# Patient Record
Sex: Female | Born: 2000 | Race: White | Hispanic: No | Marital: Single | Smoking: Former smoker
Health system: Southern US, Community
[De-identification: ages and names within clinical notes are randomized; demographics above are authoritative.]

## PROBLEM LIST (undated history)

## (undated) DIAGNOSIS — D649 Anemia, unspecified: Secondary | ICD-10-CM

## (undated) DIAGNOSIS — B999 Unspecified infectious disease: Secondary | ICD-10-CM

## (undated) HISTORY — PX: NO PAST SURGERIES: SHX2092

---
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
## 2020-01-01 ENCOUNTER — Other Ambulatory Visit: Payer: Self-pay

## 2020-01-01 ENCOUNTER — Encounter (HOSPITAL_COMMUNITY): Payer: Self-pay | Admitting: *Deleted

## 2020-01-01 ENCOUNTER — Emergency Department (HOSPITAL_COMMUNITY)
Admission: EM | Admit: 2020-01-01 | Discharge: 2020-01-01 | Disposition: A | Discharge status: Home / Self Care | Payer: Medicaid Other | Attending: Emergency Medicine | Admitting: Emergency Medicine

## 2020-01-01 ENCOUNTER — Telehealth: Payer: Self-pay | Admitting: *Deleted

## 2020-01-01 DIAGNOSIS — Z91040 Latex allergy status: Secondary | ICD-10-CM | POA: Diagnosis not present

## 2020-01-01 DIAGNOSIS — N898 Other specified noninflammatory disorders of vagina: Secondary | ICD-10-CM | POA: Diagnosis not present

## 2020-01-01 DIAGNOSIS — Y998 Other external cause status: Secondary | ICD-10-CM | POA: Insufficient documentation

## 2020-01-01 DIAGNOSIS — Z79899 Other long term (current) drug therapy: Secondary | ICD-10-CM | POA: Diagnosis not present

## 2020-01-01 DIAGNOSIS — Y9389 Activity, other specified: Secondary | ICD-10-CM | POA: Diagnosis not present

## 2020-01-01 DIAGNOSIS — R102 Pelvic and perineal pain: Secondary | ICD-10-CM | POA: Diagnosis not present

## 2020-01-01 DIAGNOSIS — W108XXA Fall (on) (from) other stairs and steps, initial encounter: Secondary | ICD-10-CM | POA: Insufficient documentation

## 2020-01-01 DIAGNOSIS — Z3A01 Less than 8 weeks gestation of pregnancy: Secondary | ICD-10-CM | POA: Insufficient documentation

## 2020-01-01 DIAGNOSIS — O23591 Infection of other part of genital tract in pregnancy, first trimester: Secondary | ICD-10-CM | POA: Insufficient documentation

## 2020-01-01 DIAGNOSIS — Y9289 Other specified places as the place of occurrence of the external cause: Secondary | ICD-10-CM | POA: Insufficient documentation

## 2020-01-01 LAB — BASIC METABOLIC PANEL
Anion gap: 8 (ref 5–15)
BUN: 9 mg/dL (ref 6–20)
CO2: 24 mmol/L (ref 22–32)
Calcium: 9 mg/dL (ref 8.9–10.3)
Chloride: 107 mmol/L (ref 98–111)
Creatinine, Ser: 0.61 mg/dL (ref 0.44–1.00)
GFR calc Af Amer: >60 mL/min (ref >60)
GFR calc non Af Amer: >60 mL/min (ref >60)
Glucose, Bld: 86 mg/dL (ref 70–99)
Potassium: 3.6 mmol/L (ref 3.5–5.1)
Sodium: 139 mmol/L (ref 135–145)

## 2020-01-01 LAB — URINALYSIS, ROUTINE W REFLEX MICROSCOPIC
Bilirubin Urine: NEGATIVE
Glucose, UA: NEGATIVE mg/dL
Hgb urine dipstick: NEGATIVE
Ketones, ur: NEGATIVE mg/dL
Leukocytes,Ua: NEGATIVE
Nitrite: NEGATIVE
Protein, ur: NEGATIVE mg/dL
Specific Gravity, Urine: 1.02 (ref 1.005–1.030)
pH: 5 (ref 5.0–8.0)

## 2020-01-01 LAB — WET PREP, GENITAL
Clue Cells Wet Prep HPF POC: NONE SEEN
Sperm: NONE SEEN
Trich, Wet Prep: NONE SEEN
Yeast Wet Prep HPF POC: NONE SEEN

## 2020-01-01 LAB — CBC
HCT: 42.4 % (ref 36.0–46.0)
Hemoglobin: 13.8 g/dL (ref 12.0–15.0)
MCH: 30.3 pg (ref 26.0–34.0)
MCHC: 32.5 g/dL (ref 30.0–36.0)
MCV: 93 fL (ref 80.0–100.0)
Platelets: 283 10*3/uL (ref 150–400)
RBC: 4.56 MIL/uL (ref 3.87–5.11)
RDW: 14.3 % (ref 11.5–15.5)
WBC: 14.1 10*3/uL — ABNORMAL HIGH (ref 4.0–10.5)
nRBC: 0 % (ref 0.0–0.2)

## 2020-01-01 LAB — HCG, QUANTITATIVE, PREGNANCY: hCG, Beta Chain, Quant, S: 28381 m[IU]/mL — ABNORMAL HIGH (ref <5)

## 2020-01-01 MED ORDER — ACETAMINOPHEN 325 MG PO TABS
650.0000 mg | ORAL_TABLET | Freq: Once | ORAL | Status: AC
  Administered 2020-01-01: 19:00:00 650 mg via ORAL
  Filled 2020-01-01: qty 2

## 2020-01-01 NOTE — ED Triage Notes (Signed)
Pt slipped and fell on steps mid flight of stairs. Denies hitting her head.  Pt with abd cramping since fall, pt is about 6-[redacted] weeks pregnant.

## 2020-01-01 NOTE — ED Triage Notes (Signed)
Denies any vaginal bleeding but states new thick discharge since.

## 2020-01-01 NOTE — ED Provider Notes (Signed)
[facility] EMERGENCY DEPARTMENT Provider Note   CSN: [id number] Arrival date & time: 01/01/20  1809     History Chief Complaint  Patient presents with  . Fall    Renee Crawford is a 19 y.o. female presenting for evaluation of abdominal cramping and vaginal discharge.  Patient states she has had mucousy vaginal discharge today.  She also reports intermittent lower abdominal cramping.  Cramping last for several minutes and is severe, occurring every few hours.  It resolved without intervention.  There is nothing she can do to bring on the cramping.  Patient states yesterday she slipped on some stairs and fell backwards, landing on her back and going down the last couple stairs on her back.  She denies hitting her abdomen or her head.  She reports minimal to no pain in her back.  She is not taking anything for her cramping including Tylenol.  She denies urinary symptoms such as dysuria or hematuria.  She is 6 to [redacted] weeks pregnant.  She has had an ultrasound, but no confirmed IUP as ultrasound was too early.  She denies vaginal bleeding.  She has no other medical problems, takes no medications daily.  HPI     Past Medical History:  Diagnosis Date  . Anemia     There are no problems to display for this patient.   History reviewed. No pertinent surgical history.   OB History    Gravida  1   Para      Term      Preterm      AB      Living        SAB      TAB      Ectopic      Multiple      Live Births              Family History  Problem Relation Age of Onset  . Asthma Mother   . Healthy Father     Social History   Tobacco Use  . Smoking status: Never Smoker  . Smokeless tobacco: Never Used  Substance Use Topics  . Alcohol use: No  . Drug use: No    Home Medications Prior to Admission medications   Medication Sig Start Date End Date Taking? Authorizing Provider  Prenatal Vit-DSS-Fe Fum-FA (PRENATAL 19) tablet Take 1 tablet by mouth daily.  12/07/19  Yes Wurst, [place], PA-C  clindamycin (CLEOCIN) 300 MG capsule Take 1 capsule (300 mg total) by mouth 3 (three) times daily. X 7 days Patient not taking: Reported on 01/01/2020 12/20/19   Dartha Lodge, PA-C    Allergies    Sulfa antibiotics, Amoxicillin-pot clavulanate, and Latex  Review of Systems   Review of Systems  Genitourinary: Positive for pelvic pain and vaginal discharge.  All other systems reviewed and are negative.   Physical Exam Updated Vital Signs BP 109/69   Pulse 88   Temp 98.3 F (36.8 C) (Oral)   Resp 19   Ht 5\' 4"  (1.626 m)   Wt 90.7 kg   LMP 12/11/2019 (Exact Date)   SpO2 99%   BMI 34.33 kg/m   Physical Exam Vitals and nursing note reviewed. Exam conducted with a chaperone present.  Constitutional:      General: She is not in acute distress.    Appearance: She is well-developed.     Comments: Resting comfortably in the bed in no acute distress  HENT:     Head: Normocephalic and atraumatic.  Eyes:     Conjunctiva/sclera: Conjunctivae normal.     Pupils: Pupils are equal, round, and reactive to light.  Cardiovascular:     Rate and Rhythm: Normal rate and regular rhythm.     Pulses: Normal pulses.  Pulmonary:     Effort: Pulmonary effort is normal. No respiratory distress.     Breath sounds: Normal breath sounds. No wheezing.  Abdominal:     General: There is no distension.     Palpations: Abdomen is soft. There is no mass.     Tenderness: There is abdominal tenderness. There is no guarding or rebound.     Comments: Mild discomfort with palpation of lower abdomen.  No rigidity, guarding, distention.  Negative rebound.  No peritonitis.  Genitourinary:    Cervix: Discharge present.     Comments: Thick white discharge noted on exam.  No CMT or adnexal tenderness.  No vaginal bleeding. Musculoskeletal:        General: Normal range of motion.     Cervical back: Normal range of motion and neck supple.  Skin:    General: Skin is warm and dry.   Neurological:     Mental Status: She is alert and oriented to person, place, and time.     ED Results / Procedures / Treatments   Labs (all labs ordered are listed, but only abnormal results are displayed) Labs Reviewed  WET PREP, GENITAL - Abnormal; Notable for the following components:      Result Value   WBC, Wet Prep HPF POC MANY (*)    All other components within normal limits  CBC - Abnormal; Notable for the following components:   WBC 14.1 (*)    All other components within normal limits  HCG, QUANTITATIVE, PREGNANCY - Abnormal; Notable for the following components:   hCG, Beta Chain, Quant, S 28,381 (*)    All other components within normal limits  URINE CULTURE  BASIC METABOLIC PANEL  URINALYSIS, ROUTINE W REFLEX MICROSCOPIC  GC/CHLAMYDIA PROBE AMP ([place]) NOT AT [facility]    EKG None  Radiology No results found.  Procedures Procedures (including critical care time)  Medications Ordered in ED Medications  acetaminophen (TYLENOL) tablet 650 mg (650 mg Oral Given 01/01/20 1929)    ED Course  I have reviewed the triage vital signs and the nursing notes.  Pertinent labs & imaging results that were available during my care of the patient were reviewed by me and considered in my medical decision making (see chart for details).    MDM Rules/Calculators/A&P                      Patient presenting for evaluation of low abdominal cramping and vaginal discharge.  Physical exam shows patient appears nontoxic.  She does have mild discomfort with palpation the abdomen.  GU exam shows thick white discharge, but no CMT or adnexal tenderness.  No bleeding.  Also, have low suspicion for miscarriage.  While patient has not had a confirmed IUP, with intermittent cramping and without bleeding, low suspicion for ectopic.  Will obtain wet prep, gonorrhea, chlamydia, UA, and labs. Additionally, patient fell last night. No back tenderness. No trauma or injury to the abdomen. No  vaginal bleeding. As such, doubt injury or harm to the fetus due to full yesterday.  Labs overall reassuring. Mild leukocytosis, likely due to pregnancy. hCG 28,000, within range for gestation. Urine negative for infection. Wet prep shows many white cells, no yeast, trichomoniasis, clue cells.  Discussed findings with patient. Discussed possibility of gonorrhea/committee infection, results are pending. Offered treatment today versus waiting for results and treatment as needed. Patient would like to wait and treat only if needed. Encourage patient to follow-up next week at her scheduled OB/GYN appointment. Strict return precautions given. At this time, patient appears safe for discharge. Return precautions given. Patient states she understands and agrees to plan.  Final Clinical Impression(s) / ED Diagnoses Final diagnoses:  Less than [redacted] weeks gestation of pregnancy  Vaginal discharge    Rx / DC Orders ED Discharge Orders    None       Franchot Heidelberg, PA-C 01/01/20 2105    Dorie Rank, MD 01/02/20 1055

## 2020-01-01 NOTE — Telephone Encounter (Signed)
Pt reports that she fell last night and landed on her back. She is now having a thick discharge. No bleeding. Discussed patient with Dr. Despina Hidden and he states that she doesn't need to be seen due to fall unless she has an injury. Pt informed.

## 2020-01-01 NOTE — Discharge Instructions (Addendum)
You were tested for gonorrhea and chlamydia today. Results are pending. If positive, you will receive a phone call. If negative, you will not. Either way, you may check online on MyChart. If positive, you'll need treatment. This can be done with your OB/GYN at your upcoming appointment. Use Tylenol as needed for cramping. Make sure to stay well-hydrated water. Follow-up with your OB/GYN or return to the emergency room if you develop severe worsening/constant abdominal pain, vaginal bleeding, any new, worsening, or concerning symptoms.

## 2020-01-03 LAB — GC/CHLAMYDIA PROBE AMP (~~LOC~~) NOT AT ARMC
Chlamydia: NEGATIVE
Neisseria Gonorrhea: NEGATIVE

## 2020-01-04 ENCOUNTER — Other Ambulatory Visit: Payer: Self-pay | Admitting: Obstetrics & Gynecology

## 2020-01-04 DIAGNOSIS — O3680X0 Pregnancy with inconclusive fetal viability, not applicable or unspecified: Secondary | ICD-10-CM

## 2020-01-04 LAB — URINE CULTURE: Culture: 20000 — AB

## 2020-01-05 MED ORDER — NITROFURANTOIN MONOHYD MACRO 100 MG PO CAPS
100.00 | ORAL_CAPSULE | ORAL | Status: DC
Start: 2020-01-06 — End: 2020-01-05

## 2020-01-05 NOTE — Progress Notes (Signed)
ED Antimicrobial Stewardship Positive Culture Follow Up   Renee Crawford is an 19 y.o. female who presented to [facility] on 01/01/2020 with a chief complaint of abdominal cramping and vaginal discharge, 6-[redacted] weeks pregnant.  Noted hives with Augmentin, has tolerated Amoxil in past.   Chief Complaint  Patient presents with  . Fall    Recent Results (from the past 720 hour(s))  Urine Culture     Status: Abnormal   Collection Time: 12/07/19 12:55 PM   Specimen: Urine, Random  Result Value Ref Range Status   Specimen Description   Final    URINE, RANDOM Performed at [facility], [address] [place], [place] [address]    Special Requests   Final    NONE Performed at [facility], [address] [place], [place] [address]    Culture >=100,000 COLONIES/mL ESCHERICHIA COLI (A)  Final   Report Status 12/09/2019 FINAL  Final   Organism ID, Bacteria ESCHERICHIA COLI (A)  Final      Susceptibility   Escherichia coli - MIC*    AMPICILLIN 8 SENSITIVE Sensitive     CEFAZOLIN <=4 SENSITIVE Sensitive     CEFTRIAXONE <=0.25 SENSITIVE Sensitive     CIPROFLOXACIN <=0.25 SENSITIVE Sensitive     GENTAMICIN <=1 SENSITIVE Sensitive     IMIPENEM <=0.25 SENSITIVE Sensitive     NITROFURANTOIN 32 SENSITIVE Sensitive     TRIMETH/SULFA >=320 RESISTANT Resistant     AMPICILLIN/SULBACTAM 4 SENSITIVE Sensitive     PIP/TAZO <=4 SENSITIVE Sensitive     * >=100,000 COLONIES/mL ESCHERICHIA COLI  Novel Coronavirus, NAA ([facility])     Status: None   Collection Time: 12/28/19 11:15 AM   Specimen: Nasopharyngeal(NP) swabs in vial transport medium   NASOPHARYNGE  TESTING  Result Value Ref Range Status   SARS-CoV-2, NAA Not Detected Not Detected Final    Comment: This nucleic acid amplification test was developed and its performance characteristics determined by [employer]. Nucleic acid amplification tests include RT-PCR and TMA. This test has not been FDA cleared or approved. This test has  been authorized by FDA under an Emergency Use Authorization (EUA). This test is only authorized for the duration of time the declaration that circumstances exist justifying the authorization of the emergency use of in vitro diagnostic tests for detection of SARS-CoV-2 virus and/or diagnosis of COVID-19 infection under section 564(b)(1) of the Act, 21 U.S.C. 638GYK-5(L) (1), unless the authorization is terminated or revoked sooner. When diagnostic testing is negative, the possibility of a false negative result should be considered in the context of a patient's recent exposures and the presence of clinical signs and symptoms consistent with COVID-19. An individual without symptoms of COVID-19 and who is not shedding SARS-CoV-2 virus wo uld expect to have a negative (not detected) result in this assay.   Urine culture     Status: Abnormal   Collection Time: 01/01/20  7:06 PM   Specimen: Urine, Clean Catch  Result Value Ref Range Status   Specimen Description   Final    URINE, CLEAN CATCH Performed at [facility], [address] [place], [place] [address]    Special Requests   Final    NONE Performed at [facility], [address] [place], [place] [address]    Culture 20,000 COLONIES/mL ENTEROCOCCUS FAECALIS (A)  Final   Report Status 01/04/2020 FINAL  Final   Organism ID, Bacteria ENTEROCOCCUS FAECALIS (A)  Final      Susceptibility   Enterococcus faecalis -  MIC*    AMPICILLIN <=2 SENSITIVE Sensitive     NITROFURANTOIN <=16 SENSITIVE Sensitive     VANCOMYCIN 1 SENSITIVE Sensitive     * 20,000 COLONIES/mL ENTEROCOCCUS FAECALIS  Wet prep, genital     Status: Abnormal   Collection Time: 01/01/20  7:06 PM   Specimen: Urine, Clean Catch  Result Value Ref Range Status   Yeast Wet Prep HPF POC NONE SEEN NONE SEEN Final   Trich, Wet Prep NONE SEEN NONE SEEN Final   Clue Cells Wet Prep HPF POC NONE SEEN NONE SEEN Final   WBC, Wet Prep HPF POC MANY (A) NONE SEEN Final   Sperm NONE  SEEN  Final    Comment: Performed at [facility], [address] [place], [place] [address]    [x]  Patient discharged originally without antimicrobial agent and treatment is now indicated  New antibiotic prescription: Amoxil 500 mg every 8 hours x 5 d  ED Provider: Franchot Heidelberg, Reece Leader 01/05/2020, 5:05 PM Clinical Pharmacist Monday - Friday phone -  [phone] Saturday - Sunday phone - [phone]

## 2020-01-06 ENCOUNTER — Telehealth: Payer: Self-pay | Admitting: Emergency Medicine

## 2020-01-06 NOTE — Telephone Encounter (Signed)
Post ED Visit - Positive Culture Follow-up: Successful Patient Follow-Up  Culture assessed and recommendations reviewed by:  []  , Pharm.D. []  Enzo Bi, .D., BCPS AQ-ID []  Celedonio Miyamoto, Pharm.D., BCPS []  [address], Pharm.D., BCPS []  [place], Garvin Fila.D., BCPS, AAHIVP []  , Pharm.D., BCPS, AAHIVP []  Georgina Pillion, PharmD, BCPS []  , PharmD, BCPS []  [place], PharmD, BCPS [x]  [address], PharmD  Positive urine culture  [x]  Patient discharged without antimicrobial prescription and treatment is now indicated []  Organism is resistant to prescribed ED discharge antimicrobial []  Patient with positive blood cultures  Changes discussed with ED provider: PA New antibiotic prescription: Amoxil 500 mg every 8 hours x five days  Contacted patient, date 01/06/2020, time *1400: Patient reports she was seen and treated @ [facility] on 01/05/2020   Lysle Pearl 01/06/2020, 6:17 PM

## 2020-01-07 ENCOUNTER — Ambulatory Visit (INDEPENDENT_AMBULATORY_CARE_PROVIDER_SITE_OTHER): Payer: Medicaid Other

## 2020-01-07 ENCOUNTER — Other Ambulatory Visit: Payer: Self-pay

## 2020-01-07 DIAGNOSIS — Z3A08 8 weeks gestation of pregnancy: Secondary | ICD-10-CM

## 2020-01-07 DIAGNOSIS — O3680X0 Pregnancy with inconclusive fetal viability, not applicable or unspecified: Secondary | ICD-10-CM | POA: Diagnosis not present

## 2020-01-07 NOTE — Progress Notes (Signed)
[place] 8+2 wks,single IUP w/ys,positive fht 164 bpm,normal ovaries,crl 16.12 mm

## 2020-01-09 ENCOUNTER — Ambulatory Visit: Payer: Medicaid Other | Admitting: Adult Health

## 2020-02-01 ENCOUNTER — Other Ambulatory Visit: Payer: Self-pay | Admitting: Obstetrics & Gynecology

## 2020-02-01 DIAGNOSIS — Z3682 Encounter for antenatal screening for nuchal translucency: Secondary | ICD-10-CM

## 2020-02-04 ENCOUNTER — Ambulatory Visit (INDEPENDENT_AMBULATORY_CARE_PROVIDER_SITE_OTHER): Payer: Medicaid Other

## 2020-02-04 ENCOUNTER — Ambulatory Visit (INDEPENDENT_AMBULATORY_CARE_PROVIDER_SITE_OTHER): Payer: Medicaid Other | Admitting: Advanced Practice Midwife

## 2020-02-04 ENCOUNTER — Other Ambulatory Visit: Payer: Self-pay

## 2020-02-04 ENCOUNTER — Encounter: Payer: Self-pay | Admitting: Advanced Practice Midwife

## 2020-02-04 VITALS — BP 134/81 | HR 106 | Wt 210.0 lb

## 2020-02-04 DIAGNOSIS — Z3400 Encounter for supervision of normal first pregnancy, unspecified trimester: Secondary | ICD-10-CM | POA: Insufficient documentation

## 2020-02-04 DIAGNOSIS — Z3A12 12 weeks gestation of pregnancy: Secondary | ICD-10-CM

## 2020-02-04 DIAGNOSIS — Z3401 Encounter for supervision of normal first pregnancy, first trimester: Secondary | ICD-10-CM

## 2020-02-04 DIAGNOSIS — Z3682 Encounter for antenatal screening for nuchal translucency: Secondary | ICD-10-CM | POA: Diagnosis not present

## 2020-02-04 DIAGNOSIS — Z3402 Encounter for supervision of normal first pregnancy, second trimester: Secondary | ICD-10-CM

## 2020-02-04 LAB — POCT URINALYSIS DIPSTICK OB
Blood, UA: NEGATIVE
Glucose, UA: NEGATIVE
Ketones, UA: NEGATIVE
Leukocytes, UA: NEGATIVE
Nitrite, UA: NEGATIVE
POC,PROTEIN,UA: NEGATIVE

## 2020-02-04 MED ORDER — CLINDAMYCIN HCL 300 MG PO CAPS: 300.0000 mg | ORAL_CAPSULE | Freq: Three times a day (TID) | ORAL | 0 refills | Status: DC

## 2020-02-04 MED ORDER — BLOOD PRESSURE MONITOR MISC: 0 refills | Status: DC

## 2020-02-04 NOTE — Progress Notes (Signed)
INITIAL OBSTETRICAL VISIT Patient name: Renee Crawford MRN [id number]  Date of birth: 08-06-2001 Chief Complaint:   Initial Prenatal Visit  History of Present Illness:   Renee Crawford is a 19 y.o. G1P0  female at [redacted]w[redacted]d by 8 week [place] with an Estimated Date of Delivery: 08/18/20 being seen today for her initial obstetrical visit.   Her obstetrical history is significant for first pregnancy.  Hx of panic attacks, never treated. Not worrisome Today she reports vomiting flagyl for BV.  Patient's last menstrual period was 12/11/2019 (exact date). Last pap n/a Review of Systems:   Pertinent items are noted in HPI Denies cramping/contractions, leakage of fluid, vaginal bleeding, abnormal vaginal discharge w/ itching/odor/irritation, headaches, visual changes, shortness of breath, chest pain, abdominal pain, severe nausea/vomiting, or problems with urination or bowel movements unless otherwise stated above.  Pertinent History Reviewed:  Reviewed past medical,surgical, social, obstetrical and family history.  Reviewed problem list, medications and allergies. OB History  Gravida Para Term Preterm AB Living  1            SAB TAB Ectopic Multiple Live Births               # Outcome Date GA Lbr Len/2nd Weight Sex Delivery Anes PTL Lv  1 Current            Physical Assessment:   Vitals:   02/04/20 1525  BP: 134/81  Pulse: (!) 106  Weight: 210 lb (95.3 kg)  Body mass index is 36.05 kg/m.       Physical Examination:  General appearance - well appearing, and in no distress  Mental status - alert, oriented to person, place, and time  Psych:  She has a normal mood and affect  Skin - warm and dry, normal color, no suspicious lesions noted  Chest - effort normal, all lung fields clear to auscultation bilaterally  Heart - normal rate and regular rhythm  Abdomen - soft, nontender  Extremities:  No swelling or varicosities noted     via [place] 12+2 wks,measurements c/w dates,crl 59.3 mm,fhr 159  bpm,NB present,NT 1.1 mm,posterior placenta   Results for orders placed or performed in visit on 02/04/20 (from the past 24 hour(s))  POC Urinalysis Dipstick OB   Collection Time: 02/04/20  3:39 PM  Result Value Ref Range   Color, UA     Clarity, UA     Glucose, UA Negative Negative   Bilirubin, UA     Ketones, UA n    Spec Grav, UA     Blood, UA n    pH, UA     POC,PROTEIN,UA Negative Negative, Trace, Small (1+), Moderate (2+), Large (3+), 4+   Urobilinogen, UA     Nitrite, UA n    Leukocytes, UA Negative Negative   Appearance     Odor      Assessment & Plan:  1) Low-Risk Pregnancy G1P0 at [redacted]w[redacted]d with an Estimated Date of Delivery: 08/18/20   2) Initial OB visit    Meds:  Meds ordered this encounter  Medications  . Blood Pressure Monitor MISC    Sig: For regular home bp monitoring during pregnancy    Dispense:  1 each    Refill:  0    Z34.00  . clindamycin (CLEOCIN) 300 MG capsule    Sig: Take 1 capsule (300 mg total) by mouth 3 (three) times daily.    Dispense:  5 capsule    Refill:  0    Order Specific Question:  Supervising Provider    Answer:   Florian Buff [[id number]]    Initial labs obtained Continue prenatal vitamins Reviewed n/v relief measures and warning s/s to report Reviewed recommended weight gain based on pre-gravid BMI Encouraged well-balanced diet Watched video for carrier screening/genetic testing:  Genetic Screening discussed First Screen and Integrated Screen: requested Cystic fibrosis screening requested SMA screening requested Fragile X screening requested Ultrasound discussed; fetal survey: requested CCNC completed  Follow-up: Return in about 3 weeks (around 02/25/2020) for OB Mychart visit.   Orders Placed This Encounter  Procedures  . Urine Culture  . GC/Chlamydia Probe Amp  . Integrated 1  . Obstetric Panel, Including HIV  . Hepatitis C antibody  . Hgb Fractionation Cascade  . Inheritest Core(CF97,SMA,FraX)  . MaterniT 21 plus  Core, Blood  . Pain Management Screening Profile (10S)  . POC Urinalysis Dipstick OB    Christin Fudge DNP, CNM 02/04/2020 4:04 PM

## 2020-02-04 NOTE — Progress Notes (Signed)
[place] 12+2 wks,measurements c/w dates,crl 59.3 mm,fhr 159 bpm,NB present,NT 1.1 mm,posterior placenta

## 2020-02-04 NOTE — Patient Instructions (Signed)
Renee Crawford, I greatly value your feedback.  If you receive a survey following your visit with [place] today, we appreciate you taking the time to fill it out.  Thanks, Cathie Beams, DNP, CNM  [facility] HAS MOVED!!! It is now [facility] ([address] [place], [place] [address]) Entrance located off of [address] Free 24/7 valet parking   Nausea & Vomiting  Have saltine crackers or pretzels by your bed and eat a few bites before you raise your head out of bed in the morning  Eat small frequent meals throughout the day instead of large meals  Drink plenty of fluids throughout the day to stay hydrated, just don't drink a lot of fluids with your meals.  This can make your stomach fill up faster making you feel sick  Do not brush your teeth right after you eat  Products with real ginger are good for nausea, like ginger ale and ginger hard candy Make sure it says made with real ginger!  Sucking on sour candy like lemon heads is also good for nausea  If your prenatal vitamins make you nauseated, take them at night so you will sleep through the nausea  Sea Bands  If you feel like you need medicine for the nausea & vomiting please let us know  If you are unable to keep any fluids or food down please let us know   Constipation  Drink plenty of fluid, preferably water, throughout the day  Eat foods high in fiber such as fruits, vegetables, and grains  Exercise, such as walking, is a good way to keep your bowels regular  Drink warm fluids, especially warm prune juice, or decaf coffee  Eat a 1/2 cup of real oatmeal (not instant), 1/2 cup applesauce, and 1/2-1 cup warm prune juice every day  If needed, you may take Colace (docusate sodium) stool softener once or twice a day to help keep the stool soft.   If you still are having problems with constipation, you may take Miralax once daily as needed to help keep your bowels regular.    Home Blood Pressure Monitoring for Patients   Your provider has recommended that you check your blood pressure (BP) at least once a week at home. If you do not have a blood pressure cuff at home, one will be provided for you. Contact your provider if you have not received your monitor within 1 week.   Helpful Tips for Accurate Home Blood Pressure Checks  . Don't smoke, exercise, or drink caffeine 30 minutes before checking your BP . Use the restroom before checking your BP (a full bladder can raise your pressure) . Relax in a comfortable upright chair . Feet on the ground . Left arm resting comfortably on a flat surface at the level of your heart . Legs uncrossed . Back supported . Sit quietly and don't talk . Place the cuff on your bare arm . Adjust snuggly, so that only two fingertips can fit between your skin and the top of the cuff . Check 2 readings separated by at least one minute . Keep a log of your BP readings . For a visual, please reference this diagram: http://ccnc.care/bpdiagram  Provider Name: [facility]     Phone: [phone]  Zone 1: ALL CLEAR  Continue to monitor your symptoms:  . BP reading is less than 140 (top number) or less than 90 (bottom number)  . No right upper stomach pain .  No headaches or seeing spots . No feeling nauseated or throwing up . No swelling in face and hands  Zone 2: CAUTION Call your doctor's office for any of the following:  . BP reading is greater than 140 (top number) or greater than 90 (bottom number)  . Stomach pain under your ribs in the middle or right side . Headaches or seeing spots . Feeling nauseated or throwing up . Swelling in face and hands  Zone 3: EMERGENCY  Seek immediate medical care if you have any of the following:  . BP reading is greater than160 (top number) or greater than 110 (bottom number) . Severe headaches not improving with Tylenol . Serious difficulty catching your breath . Any worsening  symptoms from Zone 2    First Trimester of Pregnancy The first trimester of pregnancy is from week 1 until the end of week 12 (months 1 through 3). A week after a sperm fertilizes an egg, the egg will implant on the wall of the uterus. This embryo will begin to develop into a baby. Genes from you and your partner are forming the baby. The female genes determine whether the baby is a boy or a girl. At 6-8 weeks, the eyes and face are formed, and the heartbeat can be seen on ultrasound. At the end of 12 weeks, all the baby's organs are formed.  Now that you are pregnant, you will want to do everything you can to have a healthy baby. Two of the most important things are to get good prenatal care and to follow your health care provider's instructions. Prenatal care is all the medical care you receive before the baby's birth. This care will help prevent, find, and treat any problems during the pregnancy and childbirth. BODY CHANGES Your body goes through many changes during pregnancy. The changes vary from woman to woman.   You may gain or lose a couple of pounds at first.  You may feel sick to your stomach (nauseous) and throw up (vomit). If the vomiting is uncontrollable, call your health care provider.  You may tire easily.  You may develop headaches that can be relieved by medicines approved by your health care provider.  You may urinate more often. Painful urination may mean you have a bladder infection.  You may develop heartburn as a result of your pregnancy.  You may develop constipation because certain hormones are causing the muscles that push waste through your intestines to slow down.  You may develop hemorrhoids or swollen, bulging veins (varicose veins).  Your breasts may begin to grow larger and become tender. Your nipples may stick out more, and the tissue that surrounds them (areola) may become darker.  Your gums may bleed and may be sensitive to brushing and flossing.  Dark  spots or blotches (chloasma, mask of pregnancy) may develop on your face. This will likely fade after the baby is born.  Your menstrual periods will stop.  You may have a loss of appetite.  You may develop cravings for certain kinds of food.  You may have changes in your emotions from day to day, such as being excited to be pregnant or being concerned that something may go wrong with the pregnancy and baby.  You may have more vivid and strange dreams.  You may have changes in your hair. These can include thickening of your hair, rapid growth, and changes in texture. Some women also have hair loss during or after pregnancy, or hair that feels dry  or thin. Your hair will most likely return to normal after your baby is born. WHAT TO EXPECT AT YOUR PRENATAL VISITS During a routine prenatal visit:  You will be weighed to make sure you and the baby are growing normally.  Your blood pressure will be taken.  Your abdomen will be measured to track your baby's growth.  The fetal heartbeat will be listened to starting around week 10 or 12 of your pregnancy.  Test results from any previous visits will be discussed. Your health care provider may ask you:  How you are feeling.  If you are feeling the baby move.  If you have had any abnormal symptoms, such as leaking fluid, bleeding, severe headaches, or abdominal cramping.  If you have any questions. Other tests that may be performed during your first trimester include:  Blood tests to find your blood type and to check for the presence of any previous infections. They will also be used to check for low iron levels (anemia) and Rh antibodies. Later in the pregnancy, blood tests for diabetes will be done along with other tests if problems develop.  Urine tests to check for infections, diabetes, or protein in the urine.  An ultrasound to confirm the proper growth and development of the baby.  An amniocentesis to check for possible genetic  problems.  Fetal screens for spina bifida and Down syndrome.  You may need other tests to make sure you and the baby are doing well. HOME CARE INSTRUCTIONS  Medicines  Follow your health care provider's instructions regarding medicine use. Specific medicines may be either safe or unsafe to take during pregnancy.  Take your prenatal vitamins as directed.  If you develop constipation, try taking a stool softener if your health care provider approves. Diet  Eat regular, well-balanced meals. Choose a variety of foods, such as meat or vegetable-based protein, fish, milk and low-fat dairy products, vegetables, fruits, and whole grain breads and cereals. Your health care provider will help you determine the amount of weight gain that is right for you.  Avoid raw meat and uncooked cheese. These carry germs that can cause birth defects in the baby.  Eating four or five small meals rather than three large meals a day may help relieve nausea and vomiting. If you start to feel nauseous, eating a few soda crackers can be helpful. Drinking liquids between meals instead of during meals also seems to help nausea and vomiting.  If you develop constipation, eat more high-fiber foods, such as fresh vegetables or fruit and whole grains. Drink enough fluids to keep your urine clear or pale yellow. Activity and Exercise  Exercise only as directed by your health care provider. Exercising will help you:  Control your weight.  Stay in shape.  Be prepared for labor and delivery.  Experiencing pain or cramping in the lower abdomen or low back is a good sign that you should stop exercising. Check with your health care provider before continuing normal exercises.  Try to avoid standing for long periods of time. Move your legs often if you must stand in one place for a long time.  Avoid heavy lifting.  Wear low-heeled shoes, and practice good posture.  You may continue to have sex unless your health care  provider directs you otherwise. Relief of Pain or Discomfort  Wear a good support bra for breast tenderness.    Take warm sitz baths to soothe any pain or discomfort caused by hemorrhoids. Use hemorrhoid cream if your  health care provider approves.    Rest with your legs elevated if you have leg cramps or low back pain.  If you develop varicose veins in your legs, wear support hose. Elevate your feet for 15 minutes, 3-4 times a day. Limit salt in your diet. Prenatal Care  Schedule your prenatal visits by the twelfth week of pregnancy. They are usually scheduled monthly at first, then more often in the last 2 months before delivery.  Write down your questions. Take them to your prenatal visits.  Keep all your prenatal visits as directed by your health care provider. Safety  Wear your seat belt at all times when driving.  Make a list of emergency phone numbers, including numbers for family, friends, the hospital, and police and fire departments. General Tips  Ask your health care provider for a referral to a local prenatal education class. Begin classes no later than at the beginning of month 6 of your pregnancy.  Ask for help if you have counseling or nutritional needs during pregnancy. Your health care provider can offer advice or refer you to specialists for help with various needs.  Do not use hot tubs, steam rooms, or saunas.  Do not douche or use tampons or scented sanitary pads.  Do not cross your legs for long periods of time.  Avoid cat litter boxes and soil used by cats. These carry germs that can cause birth defects in the baby and possibly loss of the fetus by miscarriage or stillbirth.  Avoid all smoking, herbs, alcohol, and medicines not prescribed by your health care provider. Chemicals in these affect the formation and growth of the baby.  Schedule a dentist appointment. At home, brush your teeth with a soft toothbrush and be gentle when you floss. SEEK MEDICAL  CARE IF:   You have dizziness.  You have mild pelvic cramps, pelvic pressure, or nagging pain in the abdominal area.  You have persistent nausea, vomiting, or diarrhea.  You have a bad smelling vaginal discharge.  You have pain with urination.  You notice increased swelling in your face, hands, legs, or ankles. SEEK IMMEDIATE MEDICAL CARE IF:   You have a fever.  You are leaking fluid from your vagina.  You have spotting or bleeding from your vagina.  You have severe abdominal cramping or pain.  You have rapid weight gain or loss.  You vomit blood or material that looks like coffee grounds.  You are exposed to [place] measles and have never had them.  You are exposed to fifth disease or chickenpox.  You develop a severe headache.  You have shortness of breath.  You have any kind of trauma, such as from a fall or a car accident. Document Released: 11/09/2001 Document Revised: 04/01/2014 Document Reviewed: 09/25/2013 [facility] Patient Information 2015 [place], [place]. This information is not intended to replace advice given to you by your health care provider. Make sure you discuss any questions you have with your health care provider.  Coronavirus (COVID-19) Are you at risk?  Are you at risk for the Coronavirus (COVID-19)?  To be considered HIGH RISK for Coronavirus (COVID-19), you have to meet the following criteria:  . Traveled to [place], [place], [place], [place] or [place]; or in the [place] to [place], [place], [place], or [place]; and have fever, cough, and shortness of breath within the last 2 weeks of travel OR . Been in close contact with a person diagnosed with COVID-19 within the last 2 weeks and  have fever, cough, and shortness of breath . IF YOU DO NOT MEET THESE CRITERIA, YOU ARE CONSIDERED LOW RISK FOR COVID-19.  What to do if you are HIGH RISK for COVID-19?  Marland Kitchen If you are having a medical emergency, call 911. . Seek medical care right away.  Before you go to a doctor's office, urgent care or emergency department, call ahead and tell them about your recent travel, contact with someone diagnosed with COVID-19, and your symptoms. You should receive instructions from your physician's office regarding next steps of care.  . When you arrive at healthcare provider, tell the healthcare staff immediately you have returned from visiting [place], [place], [place], [place] or [place]; or traveled in the [place] to [place], [place], [place], or [place]; in the last two weeks or you have been in close contact with a person diagnosed with COVID-19 in the last 2 weeks.   . Tell the health care staff about your symptoms: fever, cough and shortness of breath. . After you have been seen by a medical provider, you will be either: o Tested for (COVID-19) and discharged home on quarantine except to seek medical care if symptoms worsen, and asked to  - Stay home and avoid contact with others until you get your results (4-5 days)  - Avoid travel on public transportation if possible (such as bus, train, or airplane) or o Sent to the Emergency Department by EMS for evaluation, COVID-19 testing, and possible admission depending on your condition and test results.  What to do if you are LOW RISK for COVID-19?  Reduce your risk of any infection by using the same precautions used for avoiding the common cold or flu:  Marland Kitchen Wash your hands often with soap and warm water for at least 20 seconds.  If soap and water are not readily available, use an alcohol-based hand sanitizer with at least 60% alcohol.  . If coughing or sneezing, cover your mouth and nose by coughing or sneezing into the elbow areas of your shirt or coat, into a tissue or into your sleeve (not your hands). . Avoid shaking hands with others and consider head nods or verbal greetings only. . Avoid touching your eyes, nose, or mouth with unwashed hands.  . Avoid close contact with people who are  sick. . Avoid places or events with large numbers of people in one location, like concerts or sporting events. . Carefully consider travel plans you have or are making. . If you are planning any travel outside or inside the [place], visit the CDC's Travelers' Health webpage for the latest health notices. . If you have some symptoms but not all symptoms, continue to monitor at home and seek medical attention if your symptoms worsen. . If you are having a medical emergency, call 911.   [place] / e-Visit: [email or web address]         [facility]: [facility]: [id number]                   [facility]: [phone]     Safe Medications in Pregnancy   Acne: Benzoyl Peroxide Salicylic Acid  Backache/Headache: Tylenol: 2 regular strength every 4 hours OR              2 Extra strength every 6 hours  Colds/Coughs/Allergies: Benadryl (alcohol free) 25 mg every 6 hours as needed Breath right strips Claritin Cepacol throat  lozenges Chloraseptic throat spray Cold-Eeze- up to three times per day Cough drops, alcohol free Flonase (by prescription only) Guaifenesin Mucinex Robitussin DM (plain only, alcohol free) Saline nasal spray/drops Sudafed (pseudoephedrine) & Actifed ** use only after [redacted] weeks gestation and if you do not have high blood pressure Tylenol Vicks Vaporub Zinc lozenges Zyrtec   Constipation: Colace Ducolax suppositories Fleet enema Glycerin suppositories Metamucil Milk of magnesia Miralax Senokot Smooth move tea  Diarrhea: Kaopectate Imodium A-D  *NO pepto Bismol  Hemorrhoids: Anusol Anusol HC Preparation H Tucks  Indigestion: Tums Maalox Mylanta Zantac  Pepcid  Insomnia: Benadryl (alcohol free) 25mg  every 6 hours as needed Tylenol PM Unisom, no Gelcaps  Leg Cramps: Tums MagGel   Nausea/Vomiting:  Bonine Dramamine Emetrol Ginger extract Sea bands Meclizine  Nausea medication to take during pregnancy:  Unisom (doxylamine succinate 25 mg tablets) Take one tablet daily at bedtime. If symptoms are not adequately controlled, the dose can be increased to a maximum recommended dose of two tablets daily (1/2 tablet in the morning, 1/2 tablet mid-afternoon and one at bedtime). Vitamin B6 100mg  tablets. Take one tablet twice a day (up to 200 mg per day).  Skin Rashes: Aveeno products Benadryl cream or 25mg  every 6 hours as needed Calamine Lotion 1% cortisone cream  Yeast infection: Gyne-lotrimin 7 Monistat 7   **If taking multiple medications, please check labels to avoid duplicating the same active ingredients **take medication as directed on the label ** Do not exceed 4000 mg of tylenol in 24 hours **Do not take medications that contain aspirin or ibuprofen

## 2020-02-05 LAB — PMP SCREEN PROFILE (10S), URINE
Amphetamine Scrn, Ur: NEGATIVE ng/mL
BARBITURATE SCREEN URINE: NEGATIVE ng/mL
BENZODIAZEPINE SCREEN, URINE: NEGATIVE ng/mL
CANNABINOIDS UR QL SCN: NEGATIVE ng/mL
Cocaine (Metab) Scrn, Ur: NEGATIVE ng/mL
Creatinine(Crt), U: 102.4 mg/dL (ref 20.0–300.0)
Methadone Screen, Urine: NEGATIVE ng/mL
OXYCODONE+OXYMORPHONE UR QL SCN: NEGATIVE ng/mL
Opiate Scrn, Ur: NEGATIVE ng/mL
Ph of Urine: 5.5 (ref 4.5–8.9)
Phencyclidine Qn, Ur: NEGATIVE ng/mL
Propoxyphene Scrn, Ur: NEGATIVE ng/mL

## 2020-02-19 LAB — INHERITEST CORE(CF97,SMA,FRAX)

## 2020-02-19 LAB — HEPATITIS C ANTIBODY: Hep C Virus Ab: <0.1 s/co ratio (ref 0.0–0.9)

## 2020-02-19 LAB — OBSTETRIC PANEL, INCLUDING HIV
Antibody Screen: NEGATIVE
Basophils Absolute: 0.1 10*3/uL (ref 0.0–0.2)
Basos: 1 %
EOS (ABSOLUTE): 0.2 10*3/uL (ref 0.0–0.4)
Eos: 2 %
HIV Screen 4th Generation wRfx: NONREACTIVE
Hematocrit: 41.1 % (ref 34.0–46.6)
Hemoglobin: 13.9 g/dL (ref 11.1–15.9)
Hepatitis B Surface Ag: NEGATIVE
Immature Grans (Abs): 0 10*3/uL (ref 0.0–0.1)
Immature Granulocytes: 0 %
Lymphocytes Absolute: 2.3 10*3/uL (ref 0.7–3.1)
Lymphs: 20 %
MCH: 30.8 pg (ref 26.6–33.0)
MCHC: 33.8 g/dL (ref 31.5–35.7)
MCV: 91 fL (ref 79–97)
Monocytes Absolute: 0.9 10*3/uL (ref 0.1–0.9)
Monocytes: 8 %
Neutrophils Absolute: 8 10*3/uL — ABNORMAL HIGH (ref 1.4–7.0)
Neutrophils: 69 %
Platelets: 298 10*3/uL (ref 150–450)
RBC: 4.52 x10E6/uL (ref 3.77–5.28)
RDW: 13.1 % (ref 11.7–15.4)
RPR Ser Ql: NONREACTIVE
Rh Factor: POSITIVE
Rubella Antibodies, IGG: 5.33 index (ref >0.99)
WBC: 11.4 10*3/uL — ABNORMAL HIGH (ref 3.4–10.8)

## 2020-02-19 LAB — MATERNIT 21 PLUS CORE, BLOOD
Fetal Fraction: 4
Result (T21): NEGATIVE
Trisomy 13 (Patau syndrome): NEGATIVE
Trisomy 18 (Edwards syndrome): NEGATIVE
Trisomy 21 (Down syndrome): NEGATIVE

## 2020-02-19 LAB — INTEGRATED 1
Crown Rump Length: 59.3 mm
Gest. Age on Collection Date: 12.3 weeks
Maternal Age at EDD: 18.9 yr
Nuchal Translucency (NT): 1.1 mm
Number of Fetuses: 1
PAPP-A Value: 466.7 ng/mL
Weight: 210 [lb_av]

## 2020-02-19 LAB — HGB FRACTIONATION CASCADE
Hgb A2: 2.5 % (ref 1.8–3.2)
Hgb A: 97.5 % (ref 96.4–98.8)
Hgb F: 0 % (ref 0.0–2.0)
Hgb S: 0 %

## 2020-02-25 ENCOUNTER — Telehealth: Payer: Medicaid Other | Admitting: Women's Health

## 2020-03-10 ENCOUNTER — Telehealth (INDEPENDENT_AMBULATORY_CARE_PROVIDER_SITE_OTHER): Payer: Medicaid Other | Admitting: Women's Health

## 2020-03-10 ENCOUNTER — Encounter: Payer: Self-pay | Admitting: Women's Health

## 2020-03-10 VITALS — BP 121/82 | HR 108

## 2020-03-10 DIAGNOSIS — N390 Urinary tract infection, site not specified: Secondary | ICD-10-CM | POA: Insufficient documentation

## 2020-03-10 DIAGNOSIS — Z3A17 17 weeks gestation of pregnancy: Secondary | ICD-10-CM

## 2020-03-10 DIAGNOSIS — Z3402 Encounter for supervision of normal first pregnancy, second trimester: Secondary | ICD-10-CM

## 2020-03-10 DIAGNOSIS — Z363 Encounter for antenatal screening for malformations: Secondary | ICD-10-CM

## 2020-03-10 MED ORDER — LORATADINE 10 MG PO TABS: 10.0000 mg | ORAL_TABLET | Freq: Every day | ORAL | 6 refills | Status: DC

## 2020-03-10 MED ORDER — MICONAZOLE NITRATE 2 % VA CREA: 1.0000 | TOPICAL_CREAM | Freq: Every day | VAGINAL | 0 refills | Status: DC

## 2020-03-10 NOTE — Progress Notes (Signed)
TELEHEALTH VIRTUAL OBSTETRICS VISIT ENCOUNTER NOTE Patient name: Renee Crawford MRN [id number]  Date of birth: Jul 06, 2001  I connected with patient on 03/10/20 at  3:50 PM EDT by MyChart video  and verified that I am speaking with the correct person using two identifiers. Due to COVID-19 recommendations, pt is not currently in our office.    I discussed the limitations, risks, security and privacy concerns of performing an evaluation and management service by telephone and the availability of in person appointments. I also discussed with the patient that there may be a patient responsible charge related to this service. The patient expressed understanding and agreed to proceed.  Chief Complaint:   Routine Prenatal Visit  History of Present Illness:   Renee Crawford is a 19 y.o. G1P0 female at 106w0d with an Estimated Date of Delivery: 08/18/20 being evaluated today for ongoing management of a low-risk pregnancy.  Today she reports thinks she has a yeast infection, thick white d/c w/ some itching, was on antibiotics for uti. Has bladder spasms, thinks she may still have uti. Going to the beach for 1 month, leaving 4/28. Allergies, wants rx.  Contractions: Not present. Vag. Bleeding: None.   . denies leaking of fluid. Review of Systems:   Pertinent items are noted in HPI Denies abnormal vaginal discharge w/ itching/odor/irritation, headaches, visual changes, shortness of breath, chest pain, abdominal pain, severe nausea/vomiting, or problems with urination or bowel movements unless otherwise stated above. Pertinent History Reviewed:  Reviewed past medical,surgical, social, obstetrical and family history.  Reviewed problem list, medications and allergies. Physical Assessment:   Vitals:   03/10/20 1552  BP: 121/82  Pulse: (!) 108  There is no height or weight on file to calculate BMI.        Physical Examination:   General:  Alert, oriented and cooperative.   Mental Status: Normal mood  and affect perceived. Normal judgment and thought content.  Rest of physical exam deferred due to type of encounter  No results found for this or any previous visit (from the past 24 hour(s)).  Assessment & Plan:  1) Pregnancy G1P0 at [redacted]w[redacted]d with an Estimated Date of Delivery: 08/18/20   2) Presumed yeast infection, rx miconazole (per request)  3) Allergies> rx claritin (per request)  4) Recent UTI> still having bladder spasms, to come to office tomorrow to give urine for cx (&gc/ct, not resulted at new ob visit)   Meds:  Meds ordered this encounter  Medications  . loratadine (CLARITIN) 10 MG tablet    Sig: Take 1 tablet (10 mg total) by mouth daily.    Dispense:  30 tablet    Refill:  6    Order Specific Question:   Supervising Provider    Answer:   Despina Hidden, LUTHER H [[id number]]  . miconazole (MONISTAT 7) 2 % vaginal cream    Sig: Place 1 Applicatorful vaginally at bedtime. X 7 nights, no sex while using    Dispense:  45 g    Refill:  0    Order Specific Question:   Supervising Provider    Answer:   Lazaro Arms [[id number]]   Labs/procedures today: none  Plan:  Continue routine obstetrical care.  Has home bp cuff.  Check bp weekly, let us know if >140/90.  Next visit: prefers will be in person for anatomy u/s, 2nd IT    Reviewed: Preterm labor symptoms and general obstetric precautions including but not limited to vaginal bleeding, contractions, leaking of fluid and fetal movement  were reviewed in detail with the patient. The patient was advised to call back or seek an in-person office evaluation/go to [facility] at [facility] for any urgent or concerning symptoms. All questions were answered. Please refer to After Visit Summary for other counseling recommendations.    I provided 15 minutes of non-face-to-face time during this encounter.  Follow-up: Return in about 1 week (around 03/17/2020) for LROB, 2nd IT, [id number], in person, CNM (tomorrow for urine cx w/ nurse).  Orders  Placed This Encounter  Procedures  . US OB Comp + 14 Wk   Aarin Sparkman R Wynne Rozak CNM, [facility] 03/10/2020 4:23 PM

## 2020-03-10 NOTE — Patient Instructions (Addendum)
Renee Crawford, I greatly value your feedback.  If you receive a survey following your visit with [place] today, we appreciate you taking the time to fill it out.  Thanks, Knute Neu, CNM, WHNP-BC  [facility] [place] [facility] ([place],  [address]) Entrance C, located off of [address] parking    Go to [email or web address] to register for FREE online childbirth classes  [place] [facility]ians/Family Doctors:  [facility] [place] [phone]                 [place] [phone] (usually not accepting new patients unless you have family there already, you are always welcome to call and ask)       [facility] Department [phone]       [facility] Pediatricians/Family Doctors:   [facility]: [phone]  [facility]: [phone]  [facility]: [employer] Doctors:   [facility]: [facility]: [place]:  [place]: [phone]    Home Blood Pressure Monitoring for Patients   Your provider has recommended that you check your blood pressure (BP) at least once a week at home. If you do not have a blood pressure cuff at home, one will be provided for you. Contact your provider if you have not received your monitor within 1 week.   Helpful Tips for Accurate Home Blood Pressure Checks  . Don't smoke, exercise, or drink caffeine 30 minutes before checking your BP . Use the restroom before checking your BP (a full bladder can raise your pressure) . Relax in a comfortable upright chair . Feet on the ground . Left arm resting comfortably on a flat surface at the level of your heart . Legs uncrossed . Back supported . Sit quietly and don't talk . Place the cuff on your bare arm . Adjust snuggly,  so that only two fingertips can fit between your skin and the top of the cuff . Check 2 readings separated by at least one minute . Keep a log of your BP readings . For a visual, please reference this diagram: http://ccnc.care/bpdiagram  Provider Name: [facility]     Phone: [phone]  Zone 1: ALL CLEAR  Continue to monitor your symptoms:  . BP reading is less than 140 (top number) or less than 90 (bottom number)  . No right upper stomach pain . No headaches or seeing spots . No feeling nauseated or throwing up . No swelling in face and hands  Zone 2: CAUTION Call your doctor's office for any of the following:  . BP reading is greater than 140 (top number) or greater than 90 (bottom number)  . Stomach pain under your ribs in the middle or right side . Headaches or seeing spots . Feeling nauseated or throwing up . Swelling in face and hands  Zone 3: EMERGENCY  Seek immediate medical care if you have any of the following:  . BP reading is greater than160 (top number) or greater than 110 (bottom number) . Severe headaches not improving with Tylenol . Serious difficulty catching your breath . Any worsening symptoms from Zone 2     Second Trimester of Pregnancy The second trimester is from week 14 through week 27 (months 4 through 6). The second trimester is often a time when you feel  your best. Your body has adjusted to being pregnant, and you begin to feel better physically. Usually, morning sickness has lessened or quit completely, you may have more energy, and you may have an increase in appetite. The second trimester is also a time when the fetus is growing rapidly. At the end of the sixth month, the fetus is about 9 inches long and weighs about 1 pounds. You will likely begin to feel the baby move (quickening) between 16 and 20 weeks of pregnancy. Body changes during your second trimester Your body continues to go through many changes during your second trimester. The  changes vary from woman to woman.  Your weight will continue to increase. You will notice your lower abdomen bulging out.  You may begin to get stretch marks on your hips, abdomen, and breasts.  You may develop headaches that can be relieved by medicines. The medicines should be approved by your health care provider.  You may urinate more often because the fetus is pressing on your bladder.  You may develop or continue to have heartburn as a result of your pregnancy.  You may develop constipation because certain hormones are causing the muscles that push waste through your intestines to slow down.  You may develop hemorrhoids or swollen, bulging veins (varicose veins).  You may have back pain. This is caused by: ? Weight gain. ? Pregnancy hormones that are relaxing the joints in your pelvis. ? A shift in weight and the muscles that support your balance.  Your breasts will continue to grow and they will continue to become tender.  Your gums may bleed and may be sensitive to brushing and flossing.  Dark spots or blotches (chloasma, mask of pregnancy) may develop on your face. This will likely fade after the baby is born.  A dark line from your belly button to the pubic area (linea nigra) may appear. This will likely fade after the baby is born.  You may have changes in your hair. These can include thickening of your hair, rapid growth, and changes in texture. Some women also have hair loss during or after pregnancy, or hair that feels dry or thin. Your hair will most likely return to normal after your baby is born.  What to expect at prenatal visits During a routine prenatal visit:  You will be weighed to make sure you and the fetus are growing normally.  Your blood pressure will be taken.  Your abdomen will be measured to track your baby's growth.  The fetal heartbeat will be listened to.  Any test results from the previous visit will be discussed.  Your health care  provider may ask you:  How you are feeling.  If you are feeling the baby move.  If you have had any abnormal symptoms, such as leaking fluid, bleeding, severe headaches, or abdominal cramping.  If you are using any tobacco products, including cigarettes, chewing tobacco, and electronic cigarettes.  If you have any questions.  Other tests that may be performed during your second trimester include:  Blood tests that check for: ? Low iron levels (anemia). ? High blood sugar that affects pregnant women (gestational diabetes) between 35 and 28 weeks. ? Rh antibodies. This is to check for a protein on red blood cells (Rh factor).  Urine tests to check for infections, diabetes, or protein in the urine.  An ultrasound to confirm the proper growth and development of the baby.  An amniocentesis to check for possible genetic problems.  Fetal screens for spina bifida and Down syndrome.  HIV (human immunodeficiency virus) testing. Routine prenatal testing includes screening for HIV, unless you choose not to have this test.  Follow these instructions at home: Medicines  Follow your health care provider's instructions regarding medicine use. Specific medicines may be either safe or unsafe to take during pregnancy.  Take a prenatal vitamin that contains at least 600 micrograms (mcg) of folic acid.  If you develop constipation, try taking a stool softener if your health care provider approves. Eating and drinking  Eat a balanced diet that includes fresh fruits and vegetables, whole grains, good sources of protein such as meat, eggs, or tofu, and low-fat dairy. Your health care provider will help you determine the amount of weight gain that is right for you.  Avoid raw meat and uncooked cheese. These carry germs that can cause birth defects in the baby.  If you have low calcium intake from food, talk to your health care provider about whether you should take a daily calcium  supplement.  Limit foods that are high in fat and processed sugars, such as fried and sweet foods.  To prevent constipation: ? Drink enough fluid to keep your urine clear or pale yellow. ? Eat foods that are high in fiber, such as fresh fruits and vegetables, whole grains, and beans. Activity  Exercise only as directed by your health care provider. Most women can continue their usual exercise routine during pregnancy. Try to exercise for 30 minutes at least 5 days a week. Stop exercising if you experience uterine contractions.  Avoid heavy lifting, wear low heel shoes, and practice good posture.  A sexual relationship may be continued unless your health care provider directs you otherwise. Relieving pain and discomfort  Wear a good support bra to prevent discomfort from breast tenderness.  Take warm sitz baths to soothe any pain or discomfort caused by hemorrhoids. Use hemorrhoid cream if your health care provider approves.  Rest with your legs elevated if you have leg cramps or low back pain.  If you develop varicose veins, wear support hose. Elevate your feet for 15 minutes, 3-4 times a day. Limit salt in your diet. Prenatal Care  Write down your questions. Take them to your prenatal visits.  Keep all your prenatal visits as told by your health care provider. This is important. Safety  Wear your seat belt at all times when driving.  Make a list of emergency phone numbers, including numbers for family, friends, the hospital, and police and fire departments. General instructions  Ask your health care provider for a referral to a local prenatal education class. Begin classes no later than the beginning of month 6 of your pregnancy.  Ask for help if you have counseling or nutritional needs during pregnancy. Your health care provider can offer advice or refer you to specialists for help with various needs.  Do not use hot tubs, steam rooms, or saunas.  Do not douche or use  tampons or scented sanitary pads.  Do not cross your legs for long periods of time.  Avoid cat litter boxes and soil used by cats. These carry germs that can cause birth defects in the baby and possibly loss of the fetus by miscarriage or stillbirth.  Avoid all smoking, herbs, alcohol, and unprescribed drugs. Chemicals in these products can affect the formation and growth of the baby.  Do not use any products that contain nicotine or tobacco, such as cigarettes and e-cigarettes. If you need  help quitting, ask your health care provider.  Visit your dentist if you have not gone yet during your pregnancy. Use a soft toothbrush to brush your teeth and be gentle when you floss. Contact a health care provider if:  You have dizziness.  You have mild pelvic cramps, pelvic pressure, or nagging pain in the abdominal area.  You have persistent nausea, vomiting, or diarrhea.  You have a bad smelling vaginal discharge.  You have pain when you urinate. Get help right away if:  You have a fever.  You are leaking fluid from your vagina.  You have spotting or bleeding from your vagina.  You have severe abdominal cramping or pain.  You have rapid weight gain or weight loss.  You have shortness of breath with chest pain.  You notice sudden or extreme swelling of your face, hands, ankles, feet, or legs.  You have not felt your baby move in over an hour.  You have severe headaches that do not go away when you take medicine.  You have vision changes. Summary  The second trimester is from week 14 through week 27 (months 4 through 6). It is also a time when the fetus is growing rapidly.  Your body goes through many changes during pregnancy. The changes vary from woman to woman.  Avoid all smoking, herbs, alcohol, and unprescribed drugs. These chemicals affect the formation and growth your baby.  Do not use any tobacco products, such as cigarettes, chewing tobacco, and e-cigarettes. If you  need help quitting, ask your health care provider.  Contact your health care provider if you have any questions. Keep all prenatal visits as told by your health care provider. This is important. This information is not intended to replace advice given to you by your health care provider. Make sure you discuss any questions you have with your health care provider. Document Released: 11/09/2001 Document Revised: 04/22/2016 Document Reviewed: 01/16/2013 Elsevier Interactive Patient Education  2017 Elsevier Inc.  PROTECT YOURSELF & YOUR BABY FROM THE FLU! Because you are pregnant, we at [facility], along with the Centers for Disease Control (CDC), recommend that you receive the flu vaccine to protect yourself and your baby from the flu. The flu is more likely to cause severe illness in pregnant women than in women of reproductive age who are not pregnant. Changes in the immune system, heart, and lungs during pregnancy make pregnant women (and women up to two weeks postpartum) more prone to severe illness from flu, including illness resulting in hospitalization. Flu also may be harmful for a pregnant woman's developing baby. A common flu symptom is fever, which may be associated with neural tube defects and other adverse outcomes for a developing baby. Getting vaccinated can also help protect a baby after birth from flu. (Mom passes antibodies onto the developing baby during her pregnancy.)  A Flu Vaccine is the Best Protection Against Flu Getting a flu vaccine is the first and most important step in protecting against flu. Pregnant women should get a flu shot and not the live attenuated influenza vaccine (LAIV), also known as nasal spray flu vaccine. Flu vaccines given during pregnancy help protect both the mother and her baby from flu. Vaccination has been shown to reduce the risk of flu-associated acute respiratory infection in pregnant women by up to one-half. A 2018 study showed that getting a flu shot  reduced a pregnant woman's risk of being hospitalized with flu by an average of 40 percent. Pregnant women who get  a flu vaccine are also helping to protect their babies from flu illness for the first several months after their birth, when they are too young to get vaccinated.   A Long Record of Safety for Flu Shots in Pregnant Women Flu shots have been given to millions of pregnant women over many years with a good safety record. There is a lot of evidence that flu vaccines can be given safely during pregnancy; though these data are limited for the first trimester. The CDC recommends that pregnant women get vaccinated during any trimester of their pregnancy. It is very important for pregnant women to get the flu shot.   Other Preventive Actions In addition to getting a flu shot, pregnant women should take the same everyday preventive actions the CDC recommends of everyone, including covering coughs, washing hands often, and avoiding people who are sick.  Symptoms and Treatment If you get sick with flu symptoms call your doctor right away. There are antiviral drugs that can treat flu illness and prevent serious flu complications. The CDC recommends prompt treatment for people who have influenza infection or suspected influenza infection and who are at high risk of serious flu complications, such as people with asthma, diabetes (including gestational diabetes), or heart disease. Early treatment of influenza in hospitalized pregnant women has been shown to reduce the length of the hospital stay.  Symptoms Flu symptoms include fever, cough, sore throat, runny or stuffy nose, body aches, headache, chills and fatigue. Some people may also have vomiting and diarrhea. People may be infected with the flu and have respiratory symptoms without a fever.  Early Treatment is Important for Pregnant Women Treatment should begin as soon as possible because antiviral drugs work best when started early (within 48 hours  after symptoms start). Antiviral drugs can make your flu illness milder and make you feel better faster. They may also prevent serious health problems that can result from flu illness. Oral oseltamivir (Tamiflu) is the preferred treatment for pregnant women because it has the most studies available to suggest that it is safe and beneficial. Antiviral drugs require a prescription from your provider. Having a fever caused by flu infection or other infections early in pregnancy may be linked to birth defects in a baby. In addition to taking antiviral drugs, pregnant women who get a fever should treat their fever with Tylenol (acetaminophen) and contact their provider immediately.  When to Seek Emergency Medical Care If you are pregnant and have any of these signs, seek care immediately:  Difficulty breathing or shortness of breath  Pain or pressure in the chest or abdomen  Sudden dizziness  Confusion  Severe or persistent vomiting  High fever that is not responding to Tylenol (or store brand equivalent)  Decreased or no movement of your baby  [employer].pthttps://www.cdc.gov/flu/protect/vaccine/pregnant.htm

## 2020-03-11 ENCOUNTER — Other Ambulatory Visit (INDEPENDENT_AMBULATORY_CARE_PROVIDER_SITE_OTHER): Payer: Medicaid Other

## 2020-03-11 ENCOUNTER — Other Ambulatory Visit: Payer: Self-pay

## 2020-03-11 VITALS — BP 115/73 | HR 98 | Wt 208.0 lb

## 2020-03-11 DIAGNOSIS — Z3402 Encounter for supervision of normal first pregnancy, second trimester: Secondary | ICD-10-CM

## 2020-03-11 DIAGNOSIS — Z331 Pregnant state, incidental: Secondary | ICD-10-CM

## 2020-03-11 DIAGNOSIS — Z3A17 17 weeks gestation of pregnancy: Secondary | ICD-10-CM

## 2020-03-11 DIAGNOSIS — Z1389 Encounter for screening for other disorder: Secondary | ICD-10-CM

## 2020-03-11 LAB — POCT URINALYSIS DIPSTICK OB
Blood, UA: NEGATIVE
Glucose, UA: NEGATIVE
Ketones, UA: NEGATIVE
Leukocytes, UA: NEGATIVE
Nitrite, UA: NEGATIVE
POC,PROTEIN,UA: NEGATIVE

## 2020-03-11 NOTE — Progress Notes (Signed)
   NURSE VISIT- Send urine for GC/CHL   SUBJECTIVE:  Renee Crawford is a 19 y.o. G1P0 female.She is [redacted]w[redacted]d pregnant here to send urine for cx, gc/chl.  OBJECTIVE:  BP 115/73 (BP Location: Left Arm, Patient Position: Sitting, Cuff Size: Normal)   Pulse 98   Wt 208 lb (94.3 kg)   LMP 12/11/2019 (Exact Date)   BMI 35.70 kg/m   Appears well, in no apparent distress  Results for orders placed or performed in visit on 03/11/20 (from the past 24 hour(s))  POC Urinalysis Dipstick OB   Collection Time: 03/11/20  3:35 PM  Result Value Ref Range   Color, UA     Clarity, UA     Glucose, UA Negative Negative   Bilirubin, UA     Ketones, UA neg    Spec Grav, UA     Blood, UA neg    pH, UA     POC,PROTEIN,UA Negative Negative, Trace, Small (1+), Moderate (2+), Large (3+), 4+   Urobilinogen, UA     Nitrite, UA neg    Leukocytes, UA Negative Negative   Appearance     Odor      ASSESSMENT: Pregnancy 17weeks  PLAN: Send urine as ordered Joellyn Haff, CNM, [facility]   Rx sent by provider today: No Urine culture sent Call or return to clinic prn if these symptoms worsen or fail to improve as anticipated. Follow-up: as scheduled   Jobe Marker  03/11/2020 3:38 PM

## 2020-03-13 LAB — URINE CULTURE

## 2020-03-13 LAB — SPECIMEN STATUS REPORT

## 2020-03-13 LAB — GC/CHLAMYDIA PROBE AMP
Chlamydia trachomatis, NAA: NEGATIVE
Neisseria Gonorrhoeae by PCR: NEGATIVE

## 2020-03-17 ENCOUNTER — Other Ambulatory Visit: Payer: Self-pay

## 2020-03-17 ENCOUNTER — Encounter: Payer: Medicaid Other | Admitting: Advanced Practice Midwife

## 2020-03-17 ENCOUNTER — Inpatient Hospital Stay (HOSPITAL_COMMUNITY)
Admission: AD | Admit: 2020-03-17 | Discharge: 2020-03-17 | Disposition: A | Discharge status: Home / Self Care | Payer: Medicaid Other | Attending: Family Medicine | Admitting: Family Medicine

## 2020-03-17 ENCOUNTER — Encounter (HOSPITAL_COMMUNITY): Payer: Self-pay | Admitting: Family Medicine

## 2020-03-17 ENCOUNTER — Other Ambulatory Visit: Payer: Medicaid Other

## 2020-03-17 DIAGNOSIS — Z880 Allergy status to penicillin: Secondary | ICD-10-CM | POA: Diagnosis not present

## 2020-03-17 DIAGNOSIS — Z882 Allergy status to sulfonamides status: Secondary | ICD-10-CM | POA: Diagnosis not present

## 2020-03-17 DIAGNOSIS — Z3A18 18 weeks gestation of pregnancy: Secondary | ICD-10-CM | POA: Insufficient documentation

## 2020-03-17 DIAGNOSIS — O26892 Other specified pregnancy related conditions, second trimester: Secondary | ICD-10-CM | POA: Insufficient documentation

## 2020-03-17 DIAGNOSIS — R109 Unspecified abdominal pain: Secondary | ICD-10-CM | POA: Insufficient documentation

## 2020-03-17 DIAGNOSIS — Z87891 Personal history of nicotine dependence: Secondary | ICD-10-CM | POA: Diagnosis not present

## 2020-03-17 DIAGNOSIS — O4692 Antepartum hemorrhage, unspecified, second trimester: Secondary | ICD-10-CM | POA: Diagnosis not present

## 2020-03-17 LAB — URINALYSIS, ROUTINE W REFLEX MICROSCOPIC
Bilirubin Urine: NEGATIVE
Glucose, UA: NEGATIVE mg/dL
Hgb urine dipstick: NEGATIVE
Ketones, ur: 5 mg/dL — AB
Leukocytes,Ua: NEGATIVE
Nitrite: NEGATIVE
Protein, ur: NEGATIVE mg/dL
Specific Gravity, Urine: 1.023 (ref 1.005–1.030)
pH: 5 (ref 5.0–8.0)

## 2020-03-17 LAB — WET PREP, GENITAL
Clue Cells Wet Prep HPF POC: NONE SEEN
Trich, Wet Prep: NONE SEEN
Yeast Wet Prep HPF POC: NONE SEEN

## 2020-03-17 NOTE — MAU Provider Note (Signed)
History     CSN: [id number]  Arrival date and time: 03/17/20 1614   First Provider Initiated Contact with Patient 03/17/20 1941      Chief Complaint  Patient presents with  . Abdominal Pain  . Vaginal Bleeding   Ms.  Renee Crawford is a 19 y.o. year old G17P0 female at [redacted]w[redacted]d weeks gestation who presents to MAU reporting "light bleeding since last night and some cramping. She reports the VB is only seen with wiping when she goes to the bathroom. She last had SI on Saturday 03/15/20. She denies any VB until last night. She also complains that she has discomfort with urination. She receives [facility] at [facility]. Her mother is present for history taking and offered information for HPI as well.   OB History    Gravida  1   Para      Term      Preterm      AB      Living        SAB      TAB      Ectopic      Multiple      Live Births              Past Medical History:  Diagnosis Date  . Anemia     Past Surgical History:  Procedure Laterality Date  . NO PAST SURGERIES      Family History  Problem Relation Age of Onset  . Asthma Mother   . Healthy Father     Social History   Tobacco Use  . Smoking status: Former Smoker    Types: Cigarettes    Quit date: 01/28/2020    Years since quitting: 0.1  . Smokeless tobacco: Never Used  Substance Use Topics  . Alcohol use: No  . Drug use: No    Allergies:  Allergies  Allergen Reactions  . Sulfa Antibiotics Anaphylaxis and Hives  . Amoxicillin-Pot Clavulanate Hives and Other (See Comments)  . Latex Rash    Medications Prior to Admission  Medication Sig Dispense Refill Last Dose  . Blood Pressure Monitor MISC For regular home bp monitoring during pregnancy 1 each 0   . diphenhydrAMINE HCl (BENADRYL PO) Take by mouth as needed.     . loratadine (CLARITIN) 10 MG tablet Take 1 tablet (10 mg total) by mouth daily. 30 tablet 6   . miconazole (MONISTAT 7) 2 % vaginal cream Place 1 Applicatorful vaginally at  bedtime. X 7 nights, no sex while using 45 g 0   . Prenatal Vit-DSS-Fe Fum-FA (PRENATAL 19) tablet Take 1 tablet by mouth daily. 30 tablet 1     Review of Systems  Constitutional: Negative.   HENT: Negative.   Eyes: Negative.   Respiratory: Negative.   Cardiovascular: Negative.   Gastrointestinal: Negative.   Endocrine: Negative.   Genitourinary: Positive for pelvic pain ("some cramping") and vaginal bleeding (with wiping since last night).  Musculoskeletal: Negative.   Skin: Negative.   Allergic/Immunologic: Negative.   Neurological: Negative.   Hematological: Negative.   Psychiatric/Behavioral: Negative.    Physical Exam   Blood pressure 116/66, pulse 94, temperature 98.5 F (36.9 C), temperature source Oral, resp. rate 16, height 5\' 4"  (1.626 m), weight 93.1 kg, last menstrual period 12/11/2019, SpO2 99 %.  Physical Exam  Nursing note and vitals reviewed. Constitutional: She is oriented to person, place, and time. She appears well-developed and well-nourished.  HENT:  Head: Normocephalic and atraumatic.  Eyes: Pupils are equal,  round, and reactive to light.  Cardiovascular: Normal rate.  Respiratory: Effort normal.  GI: Soft.  Genitourinary:    Genitourinary Comments: Uterus: gravid, S=D, SE: cervix is smooth, pink, no lesions, moderate amt of thin, mucoid, clearish-white vaginal d/c -- WP, GC/CT done, closed/long/firm, no CMT or friability, no adnexal tenderness    Musculoskeletal:        General: Normal range of motion.     Cervical back: Normal range of motion.  Neurological: She is alert and oriented to person, place, and time.  Skin: Skin is warm and dry.  Psychiatric: She has a normal mood and affect. Her behavior is normal. Judgment and thought content normal.   FHTs by doppler: 156 bpm  MAU Course  Procedures  Patient informed that the ultrasound is considered a limited OB ultrasound and is not intended to be a complete ultrasound exam.  Patient also informed  that the ultrasound is not being completed with the intent of assessing for fetal or placental anomalies or any pelvic abnormalities.  Explained that the purpose of today's ultrasound is to assess for viability.  Baby was found to be very active. Reassurance given to patient and her mother that baby is doing well at the time of this informal U/S. Patient acknowledges the purpose of the exam and the limitations of the study.  MDM [facility] UCx - Results pending  Wet Prep Informal BS U/S  Results for orders placed or performed during the hospital encounter of 03/17/20 (from the past 24 hour(s))  Urinalysis, Routine w reflex microscopic     Status: Abnormal   Collection Time: 03/17/20  5:07 PM  Result Value Ref Range   Color, Urine YELLOW YELLOW   APPearance HAZY (A) CLEAR   Specific Gravity, Urine 1.023 1.005 - 1.030   pH 5.0 5.0 - 8.0   Glucose, UA NEGATIVE NEGATIVE mg/dL   Hgb urine dipstick NEGATIVE NEGATIVE   Bilirubin Urine NEGATIVE NEGATIVE   Ketones, ur 5 (A) NEGATIVE mg/dL   Protein, ur NEGATIVE NEGATIVE mg/dL   Nitrite NEGATIVE NEGATIVE   Leukocytes,Ua NEGATIVE NEGATIVE  Wet prep, genital     Status: Abnormal   Collection Time: 03/17/20  7:54 PM  Result Value Ref Range   Yeast Wet Prep HPF POC NONE SEEN NONE SEEN   Trich, Wet Prep NONE SEEN NONE SEEN   Clue Cells Wet Prep HPF POC NONE SEEN NONE SEEN   WBC, Wet Prep HPF POC MANY (A) NONE SEEN   Sperm PRESENT     Assessment and Plan  Vaginal bleeding in pregnancy, second trimester - Plan: Discharge patient - Return to MAU:  If you have heavier bleeding that soaks through more that 2 pads per hour for an hour or more  If you bleed so much that you feel like you might pass out or you do pass out  If you have significant abdominal pain that is not improved with Tylenol   If you develop a fever > 100.5 - Reassurance given that no evidence of blood in vagina and fetal well-being was normal on informal BS U/S - Discussed that SI  can cause cervical bleeding; which is likely what happened. - Keep scheduled appt at [facility] on 03/27/2020 - Patient verbalized an understanding of the plan of care and agrees.   Raelyn Mora, MSN, CNM 03/17/2020, 7:41 PM

## 2020-03-17 NOTE — Discharge Instructions (Signed)
There was no sign of vaginal bleeding on exam tonight's exam. Your vaginal discharge did result in any infection. If you have any vaginal bleeding: Return to MAU:  If you have heavier bleeding that soaks through more that 2 pads per hour for an hour or more  If you bleed so much that you feel like you might pass out or you do pass out  If you have significant abdominal pain that is not improved with Tylenol 1000 mg every 6 hours  If you develop a fever > 100.5

## 2020-03-17 NOTE — MAU Note (Signed)
Renee Crawford is a 19 y.o. at [redacted]w[redacted]d here in MAU reporting: light bleeding since last night and having some cramping. Notices the bleeding when she wipes. No other abnormal discharge. Last IC was the day before yesterday.   Onset of complaint: last night  Pain score: 6/10  Vitals:   03/17/20 1713  BP: 116/66  Pulse: 94  Resp: 16  Temp: 98.5 F (36.9 C)  SpO2: 99%     FHT: 156  Lab orders placed from triage: UA

## 2020-03-19 LAB — CULTURE, OB URINE: Culture: >=100000 — AB

## 2020-03-27 ENCOUNTER — Encounter: Payer: Self-pay | Admitting: Obstetrics & Gynecology

## 2020-03-27 ENCOUNTER — Ambulatory Visit (INDEPENDENT_AMBULATORY_CARE_PROVIDER_SITE_OTHER): Payer: Medicaid Other | Admitting: Obstetrics & Gynecology

## 2020-03-27 ENCOUNTER — Other Ambulatory Visit: Payer: Self-pay

## 2020-03-27 ENCOUNTER — Ambulatory Visit (INDEPENDENT_AMBULATORY_CARE_PROVIDER_SITE_OTHER): Payer: Medicaid Other

## 2020-03-27 VITALS — BP 107/73 | HR 85 | Wt 203.5 lb

## 2020-03-27 DIAGNOSIS — Z3A19 19 weeks gestation of pregnancy: Secondary | ICD-10-CM

## 2020-03-27 DIAGNOSIS — Z1389 Encounter for screening for other disorder: Secondary | ICD-10-CM

## 2020-03-27 DIAGNOSIS — Z363 Encounter for antenatal screening for malformations: Secondary | ICD-10-CM

## 2020-03-27 DIAGNOSIS — Z3402 Encounter for supervision of normal first pregnancy, second trimester: Secondary | ICD-10-CM

## 2020-03-27 DIAGNOSIS — Z331 Pregnant state, incidental: Secondary | ICD-10-CM

## 2020-03-27 LAB — POCT URINALYSIS DIPSTICK OB
Blood, UA: NEGATIVE
Glucose, UA: NEGATIVE
Ketones, UA: NEGATIVE
Leukocytes, UA: NEGATIVE
Nitrite, UA: NEGATIVE
POC,PROTEIN,UA: NEGATIVE

## 2020-03-27 NOTE — Progress Notes (Signed)
   LOW-RISK PREGNANCY VISIT Patient name: Renee Crawford MRN [id number]  Date of birth: 09-25-2001 Chief Complaint:   Routine Prenatal Visit ([place] & 2nd IT today)  History of Present Illness:   Renee Crawford is a 19 y.o. G1P0 female at [redacted]w[redacted]d with an Estimated Date of Delivery: 08/18/20 being seen today for ongoing management of a low-risk pregnancy.  Depression screen PHQ 2/9 02/04/2020  Decreased Interest 0  Down, Depressed, Hopeless 0  PHQ - 2 Score 0    Today she reports no complaints. Contractions: Not present. Vag. Bleeding: None.   . denies leaking of fluid. Review of Systems:   Pertinent items are noted in HPI Denies abnormal vaginal discharge w/ itching/odor/irritation, headaches, visual changes, shortness of breath, chest pain, abdominal pain, severe nausea/vomiting, or problems with urination or bowel movements unless otherwise stated above. Pertinent History Reviewed:  Reviewed past medical,surgical, social, obstetrical and family history.  Reviewed problem list, medications and allergies. Physical Assessment:   Vitals:   03/27/20 1507  BP: 107/73  Pulse: 85  Weight: 203 lb 8 oz (92.3 kg)  Body mass index is 34.93 kg/m.        Physical Examination:   General appearance: Well appearing, and in no distress  Mental status: Alert, oriented to person, place, and time  Skin: Warm & dry  Cardiovascular: Normal heart rate noted  Respiratory: Normal respiratory effort, no distress  Abdomen: Soft, gravid, nontender  Pelvic: Cervical exam deferred         Extremities: Edema: None  Fetal Status: Fetal Heart Rate (bpm): 148 Fundal Height: 20 cm      Chaperone: n/a    Results for orders placed or performed in visit on 03/27/20 (from the past 24 hour(s))  POC Urinalysis Dipstick OB   Collection Time: 03/27/20  3:02 PM  Result Value Ref Range   Color, UA     Clarity, UA     Glucose, UA Negative Negative   Bilirubin, UA     Ketones, UA neg    Spec Grav, UA     Blood,  UA neg    pH, UA     POC,PROTEIN,UA Negative Negative, Trace, Small (1+), Moderate (2+), Large (3+), 4+   Urobilinogen, UA     Nitrite, UA neg    Leukocytes, UA Negative Negative   Appearance     Odor      Assessment & Plan:  1) Low-risk pregnancy G1P0 at [redacted]w[redacted]d with an Estimated Date of Delivery: 08/18/20   Sonogram normal   Meds: No orders of the defined types were placed in this encounter.  Labs/procedures today: sonogram 2nd IT  Plan:  Continue routine obstetrical care  Next visit: prefers in person visits    Reviewed:  labor symptoms and general obstetric precautions including but not limited to vaginal bleeding, contractions, leaking of fluid and fetal movement were reviewed in detail with the patient.  All questions were answered.  home bp cuff. Rx faxed to . Check bp weekly, let us know if >140/90.   Follow-up: Return in about 4 weeks (around 04/24/2020) for LROB.  Orders Placed This Encounter  Procedures  . INTEGRATED 2  . POC Urinalysis Dipstick OB   Lazaro Arms  03/27/2020 3:29 PM

## 2020-03-27 NOTE — Progress Notes (Signed)
[place] 19+3 wks,cephalic,posterior placenta gr 0,normal ovaries,cx 3.6 cm,svp of fluid 4.6 cm,fhr 148 bpm,EFW 297 g 49%,anatomy complete,no obvious abnormalities

## 2020-03-29 LAB — INTEGRATED 2
AFP MoM: 1.21
Alpha-Fetoprotein: 49.3 ng/mL
Crown Rump Length: 59.3 mm
DIA MoM: 0.63
DIA Value: 91.2 pg/mL
Estriol, Unconjugated: 1.61 ng/mL
Gest. Age on Collection Date: 12.3 weeks
Gestational Age: 19.7 weeks
Maternal Age at EDD: 18.9 yr
Nuchal Translucency (NT): 1.1 mm
Nuchal Translucency MoM: 0.83
Number of Fetuses: 1
PAPP-A MoM: 0.82
PAPP-A Value: 466.7 ng/mL
Test Results:: NEGATIVE
Weight: 210 [lb_av]
Weight: 210 [lb_av]
hCG MoM: 0.77
hCG Value: 13.3 IU/mL
uE3 MoM: 0.86

## 2020-04-18 ENCOUNTER — Inpatient Hospital Stay (HOSPITAL_COMMUNITY)
Admission: AD | Admit: 2020-04-18 | Discharge: 2020-04-18 | Disposition: A | Discharge status: Home / Self Care | Payer: Medicaid Other | Attending: Family Medicine | Admitting: Family Medicine

## 2020-04-18 ENCOUNTER — Other Ambulatory Visit: Payer: Self-pay

## 2020-04-18 ENCOUNTER — Encounter (HOSPITAL_COMMUNITY): Payer: Self-pay | Admitting: Family Medicine

## 2020-04-18 DIAGNOSIS — O1202 Gestational edema, second trimester: Secondary | ICD-10-CM | POA: Insufficient documentation

## 2020-04-18 DIAGNOSIS — Z882 Allergy status to sulfonamides status: Secondary | ICD-10-CM | POA: Diagnosis not present

## 2020-04-18 DIAGNOSIS — O99282 Endocrine, nutritional and metabolic diseases complicating pregnancy, second trimester: Secondary | ICD-10-CM | POA: Insufficient documentation

## 2020-04-18 DIAGNOSIS — R600 Localized edema: Secondary | ICD-10-CM

## 2020-04-18 DIAGNOSIS — Z3A23 23 weeks gestation of pregnancy: Secondary | ICD-10-CM

## 2020-04-18 DIAGNOSIS — Z3A22 22 weeks gestation of pregnancy: Secondary | ICD-10-CM | POA: Insufficient documentation

## 2020-04-18 DIAGNOSIS — O26892 Other specified pregnancy related conditions, second trimester: Secondary | ICD-10-CM | POA: Diagnosis present

## 2020-04-18 DIAGNOSIS — Z87891 Personal history of nicotine dependence: Secondary | ICD-10-CM | POA: Insufficient documentation

## 2020-04-18 DIAGNOSIS — E860 Dehydration: Secondary | ICD-10-CM | POA: Diagnosis not present

## 2020-04-18 DIAGNOSIS — Z880 Allergy status to penicillin: Secondary | ICD-10-CM | POA: Insufficient documentation

## 2020-04-18 HISTORY — DX: Unspecified infectious disease: B99.9

## 2020-04-18 LAB — URINALYSIS, ROUTINE W REFLEX MICROSCOPIC
Bilirubin Urine: NEGATIVE
Glucose, UA: NEGATIVE mg/dL
Hgb urine dipstick: NEGATIVE
Ketones, ur: 80 mg/dL — AB
Leukocytes,Ua: NEGATIVE
Nitrite: NEGATIVE
Protein, ur: 30 mg/dL — AB
Specific Gravity, Urine: 1.03 (ref 1.005–1.030)
pH: 5 (ref 5.0–8.0)

## 2020-04-18 NOTE — MAU Note (Signed)
Feet are hurting really bad, started about 3 days ago. Was told to come in since office closed until Monday.  Has to pee a lot, no pain, just has noted an increase since the swelling has increased. Denies HA, visual changes or epigastric pain

## 2020-04-18 NOTE — MAU Provider Note (Signed)
History     CSN: [id number]  Arrival date and time: 04/18/20 1727   First Provider Initiated Contact with Patient 04/18/20 1919      Chief Complaint  Patient presents with  . Foot Swelling  . Foot Pain   HPI  Ms.Renee Crawford is a 19 y.o. female G1P0 @ [redacted]w[redacted]d here in MAU with complaints of bilateral lower extremity edema that has progressively gotten worse over the last few weeks. She has not tried anything for the swelling. She attest to working  40 hours a week at [employer], stands on her feet for 8 hours. She gets one 30 min break per shift. She feels this is contributing to the problem. Sitting makes the swelling better. If she touches her legs and feet it hurts. However if she is not touching her leg and denies pain. She has not redness on her legs.   OB History    Gravida  1   Para      Term      Preterm      AB      Living        SAB      TAB      Ectopic      Multiple      Live Births              Past Medical History:  Diagnosis Date  . Anemia   . Infection    UTI    Past Surgical History:  Procedure Laterality Date  . NO PAST SURGERIES      Family History  Problem Relation Age of Onset  . Asthma Mother   . Healthy Father     Social History   Tobacco Use  . Smoking status: Former Smoker    Types: Cigarettes    Quit date: 01/28/2020    Years since quitting: 0.2  . Smokeless tobacco: Never Used  Substance Use Topics  . Alcohol use: No  . Drug use: No    Allergies:  Allergies  Allergen Reactions  . Sulfa Antibiotics Anaphylaxis and Hives  . Amoxicillin-Pot Clavulanate Hives and Other (See Comments)  . Latex Rash    Medications Prior to Admission  Medication Sig Dispense Refill Last Dose  . acetaminophen (TYLENOL) 500 MG tablet Take 500 mg by mouth every 6 (six) hours as needed.   04/18/2020 at Unknown time  . diphenhydrAMINE HCl (BENADRYL PO) Take by mouth as needed.   Past Month at Unknown time  . Prenatal Vit-DSS-Fe  Fum-FA (PRENATAL 19) tablet Take 1 tablet by mouth daily. 30 tablet 1 04/18/2020 at Unknown time  . Blood Pressure Monitor MISC For regular home bp monitoring during pregnancy 1 each 0    Results for orders placed or performed during the hospital encounter of 04/18/20 (from the past 72 hour(s))  Urinalysis, Routine w reflex microscopic     Status: Abnormal   Collection Time: 04/18/20  6:55 PM  Result Value Ref Range   Color, Urine AMBER (A) YELLOW    Comment: BIOCHEMICALS MAY BE AFFECTED BY COLOR   APPearance HAZY (A) CLEAR   Specific Gravity, Urine 1.030 1.005 - 1.030   pH 5.0 5.0 - 8.0   Glucose, UA NEGATIVE NEGATIVE mg/dL   Hgb urine dipstick NEGATIVE NEGATIVE   Bilirubin Urine NEGATIVE NEGATIVE   Ketones, ur 80 (A) NEGATIVE mg/dL   Protein, ur 30 (A) NEGATIVE mg/dL   Nitrite NEGATIVE NEGATIVE   Leukocytes,Ua NEGATIVE NEGATIVE   RBC / HPF  0-5 0 - 5 RBC/hpf   WBC, UA 0-5 0 - 5 WBC/hpf   Bacteria, UA RARE (A) NONE SEEN   Squamous Epithelial / LPF 11-20 0 - 5   Mucus PRESENT     Comment: Performed at [facility], [address] [place], [place] [address]   Review of Systems  Cardiovascular: Positive for leg swelling. Negative for chest pain and palpitations.  Gastrointestinal: Negative for abdominal pain.  Genitourinary: Negative for vaginal bleeding.   Physical Exam   Blood pressure 118/68, pulse (!) 103, temperature 98.7 F (37.1 C), temperature source Oral, resp. rate 18, height 5\' 3"  (1.6 m), weight 93.5 kg, last menstrual period 12/11/2019, SpO2 100 %.  Physical Exam  Constitutional: She is oriented to person, place, and time. She appears well-developed and well-nourished. No distress.  HENT:  Head: Normocephalic.  Eyes: Pupils are equal, round, and reactive to light.  Cardiovascular: Normal rate.  Respiratory: Effort normal and breath sounds normal. No respiratory distress. She has no wheezes. She has no rales.  Musculoskeletal:        General: Normal range of  motion.     Right shoulder: Swelling present.     Right knee: Swelling present.     Left knee: Swelling present.     Right ankle: Swelling present.     Left ankle: Swelling present.     Comments: Bilateral lower extremity edema, 1+ pitting bilateral   Neurological: She is alert and oriented to person, place, and time.  Skin: Skin is warm. She is not diaphoretic.  Psychiatric: Her behavior is normal.   MAU Course  Procedures  None  MDM  + fetal heart tones via doppler  80 of ketones on UA- which may be contributing to her increased vascular swelling.  Unlikely DVT- no pain or erythema- equal swelling on both extremities   Assessment and Plan   A:  1. Bilateral lower extremity edema   2. [redacted] weeks gestation of pregnancy   3. Acute dehydration     P:  Discharge home in stable condition 60-80 ounces of water per day- recommended May need a work note for frequent breaks Ted hose while working June when on break and when home Return to MAU if symptoms worsen Work note provider to be off for the wkd.    Nutritional therapist I, NP 04/21/2020 8:07 AM

## 2020-04-18 NOTE — Discharge Instructions (Signed)
Edema  Edema is when you have too much fluid in your body or under your skin. Edema may make your legs, feet, and ankles swell up. Swelling is also common in looser tissues, like around your eyes. This is a common condition. It gets more common as you get older. There are many possible causes of edema. Eating too much salt (sodium) and being on your feet or sitting for a long time can cause edema in your legs, feet, and ankles. Hot weather may make edema worse. Edema is usually painless. Your skin may look swollen or shiny. Follow these instructions at home:  Keep the swollen body part raised (elevated) above the level of your heart when you are sitting or lying down.  Do not sit still or stand for a long time.  Do not wear tight clothes. Do not wear garters on your upper legs.  Exercise your legs. This can help the swelling go down.  Wear elastic bandages or support stockings as told by your doctor.  Eat a low-salt (low-sodium) diet to reduce fluid as told by your doctor.  Depending on the cause of your swelling, you may need to limit how much fluid you drink (fluid restriction).  Take over-the-counter and prescription medicines only as told by your doctor. Contact a doctor if:  Treatment is not working.  You have heart, liver, or kidney disease and have symptoms of edema.  You have sudden and unexplained weight gain. Get help right away if:  You have shortness of breath or chest pain.  You cannot breathe when you lie down.  You have pain, redness, or warmth in the swollen areas.  You have heart, liver, or kidney disease and get edema all of a sudden.  You have a fever and your symptoms get worse all of a sudden. Summary  Edema is when you have too much fluid in your body or under your skin.  Edema may make your legs, feet, and ankles swell up. Swelling is also common in looser tissues, like around your eyes.  Raise (elevate) the swollen body part above the level of your  heart when you are sitting or lying down.  Follow your doctor's instructions about diet and how much fluid you can drink (fluid restriction). This information is not intended to replace advice given to you by your health care provider. Make sure you discuss any questions you have with your health care provider. Document Revised: 11/18/2017 Document Reviewed: 12/03/2016 Elsevier Patient Education  2020 [employer]  

## 2020-04-22 ENCOUNTER — Telehealth: Payer: Self-pay | Admitting: Advanced Practice Midwife

## 2020-04-22 NOTE — Telephone Encounter (Signed)
Patient called stating that she is pregnant and she just started working, pt would like to speak with a nurse because she is having back pain and feet pain. Please contact pt

## 2020-04-22 NOTE — Telephone Encounter (Signed)
Called patient back. No answer, phone just rings. Pt has visit on 04/24/20 can discuss with provider at that time.

## 2020-04-24 ENCOUNTER — Ambulatory Visit (INDEPENDENT_AMBULATORY_CARE_PROVIDER_SITE_OTHER): Payer: Medicaid Other | Admitting: Advanced Practice Midwife

## 2020-04-24 VITALS — BP 98/65 | HR 85 | Wt 206.0 lb

## 2020-04-24 DIAGNOSIS — Z3A23 23 weeks gestation of pregnancy: Secondary | ICD-10-CM

## 2020-04-24 DIAGNOSIS — Z3403 Encounter for supervision of normal first pregnancy, third trimester: Secondary | ICD-10-CM

## 2020-04-24 NOTE — Patient Instructions (Signed)

## 2020-04-24 NOTE — Progress Notes (Signed)
   LOW-RISK PREGNANCY VISIT Patient name: Renee Crawford MRN [id number]  Date of birth: 09/20/2001 Chief Complaint:   Routine Prenatal Visit  History of Present Illness:   Renee Crawford is a 19 y.o. G1P0 female at [redacted]w[redacted]d with an Estimated Date of Delivery: 08/18/20 being seen today for ongoing management of a low-risk pregnancy.  Today she reports backache and lower extremity swelling while she works. Contractions: Not present. Vag. Bleeding: None.  Movement: Present. denies leaking of fluid. Review of Systems:   Pertinent items are noted in HPI Denies abnormal vaginal discharge w/ itching/odor/irritation, headaches, visual changes, shortness of breath, chest pain, abdominal pain, severe nausea/vomiting, or problems with urination or bowel movements unless otherwise stated above. Pertinent History Reviewed:  Reviewed past medical,surgical, social, obstetrical and family history.  Reviewed problem list, medications and allergies. Physical Assessment:   Vitals:   04/24/20 1358  BP: 98/65  Pulse: 85  Weight: 206 lb (93.4 kg)  Body mass index is 36.49 kg/m.        Physical Examination:   General appearance: Well appearing, and in no distress  Mental status: Alert, oriented to person, place, and time  Skin: Warm & dry  Cardiovascular: Normal heart rate noted  Respiratory: Normal respiratory effort, no distress  Abdomen: Soft, gravid, nontender  Pelvic: Cervical exam deferred         Extremities: Edema: None  Fetal Status: Fetal Heart Rate (bpm): 144 Fundal Height: 22 cm Movement: Present    Chaperone: n/a    No results found for this or any previous visit (from the past 24 hour(s)).  Assessment & Plan:  1) Low-risk pregnancy G1P0 at [redacted]w[redacted]d with an Estimated Date of Delivery: 08/18/20   2) Lower extremity swelling- Encouraged patient to drink lots of water and take breaks as frequently as possible. Elevate legs as needed. Wear compression stockings as needed.   Meds: No orders  of the defined types were placed in this encounter.  Labs/procedures today: None  Plan:  Continue routine obstetrical care  Next visit: prefers in person    Reviewed: Preterm labor symptoms and general obstetric precautions including but not limited to vaginal bleeding, contractions, leaking of fluid and fetal movement were reviewed in detail with the patient.  All questions were answered. Has home bp cuff. Check bp weekly, let us know if >140/90.   Follow-up: Return in about 4 weeks (around 05/22/2020) for PN2/LROB.  No orders of the defined types were placed in this encounter.  Fuller Mandril Student NP 04/24/2020 2:29 PM

## 2020-05-13 ENCOUNTER — Encounter (HOSPITAL_COMMUNITY): Payer: Self-pay | Admitting: Family Medicine

## 2020-05-13 ENCOUNTER — Other Ambulatory Visit: Payer: Self-pay

## 2020-05-13 ENCOUNTER — Inpatient Hospital Stay (HOSPITAL_COMMUNITY)
Admission: AD | Admit: 2020-05-13 | Discharge: 2020-05-13 | Disposition: A | Discharge status: Home / Self Care | Payer: Medicaid Other | Attending: Family Medicine | Admitting: Family Medicine

## 2020-05-13 DIAGNOSIS — Z882 Allergy status to sulfonamides status: Secondary | ICD-10-CM | POA: Diagnosis not present

## 2020-05-13 DIAGNOSIS — Z3689 Encounter for other specified antenatal screening: Secondary | ICD-10-CM

## 2020-05-13 DIAGNOSIS — Z87891 Personal history of nicotine dependence: Secondary | ICD-10-CM | POA: Insufficient documentation

## 2020-05-13 DIAGNOSIS — Z3A26 26 weeks gestation of pregnancy: Secondary | ICD-10-CM | POA: Diagnosis not present

## 2020-05-13 DIAGNOSIS — Z880 Allergy status to penicillin: Secondary | ICD-10-CM | POA: Diagnosis not present

## 2020-05-13 DIAGNOSIS — O368120 Decreased fetal movements, second trimester, not applicable or unspecified: Secondary | ICD-10-CM | POA: Insufficient documentation

## 2020-05-13 DIAGNOSIS — Z87440 Personal history of urinary (tract) infections: Secondary | ICD-10-CM | POA: Insufficient documentation

## 2020-05-13 NOTE — Discharge Instructions (Signed)
Fetal Monitoring Overview Monitoring your developing baby (fetus) before birth can identify potential problems for you and your baby. Fetal monitoring can:  Help prevent serious problems from developing.  Guide health care providers in how best to care for your unborn baby.  Check your unborn baby's overall health. The amount of monitoring and the specific tests that will be done will vary. It will depend on whether your pregnancy is considered to be low or high risk. For example, you may need certain tests if you have a medical problem that can put your unborn baby at risk. Health care providers use several techniques and tests to monitor your baby before birth. No single test is perfectly accurate, and you may need to follow up with your health care provider if there are any concerns. Fetal movement counts When you are past 20 weeks of pregnancy, you may feel your baby move. As your baby gets bigger, these movements are easier to feel. A fetal movement count is when you count the number of movements (kicks, flutters, swishes, rolls, or jabs) over a specific period of time. You record the number and report it to your health care provider. This is often recommended in high-risk pregnancies, but it is good for every pregnant woman to do. Your health care provider may ask you to start counting fetal movements at 28 weeks of pregnancy. Non-stress test The non-stress test:  Evaluates fetal heartbeat: ? While your baby is at rest. ? While your baby is moving.  Is often done as part of a set of tests called the biophysical profile.  May show signs that it is time for your baby to be delivered. The heart rate in a healthy baby will speed up when the baby moves or kicks. The heart rate will decrease at rest, and the peaks or accelerations of the heart rate will be lower. If the test brings up any questions or concerns, you may need to have more testing. This test may be done if your pregnancy goes  past your due date. It is also commonly done in high-risk pregnancies beginning at 32 weeks. Biophysical profile The biophysical profile is a set of tests that are done to find out how well the baby is doing. It includes the non-stress test along with imaging tests that use sound waves (ultrasound) to create images of your baby. In a biophysical profile, the following tests are done and scored:  Fetal heartbeat.  Fetal breathing.  Fetal movements.  Fetal muscle tone.  Amount of amniotic fluid. Each test gets a score of either 2 (normal) or 0 (abnormal). The scores are then added together for a total score. A low score may mean that you and your baby need additional monitoring or special care. Sometimes your health care provider may recommend that you deliver early. This test is usually done after 32 weeks of pregnancy. It can be done sooner, if needed. Contraction stress test A contraction stress test monitors the heart rate of your baby during contractions. The test checks to see how your baby will tolerate the stress of labor. Health care providers use this test to further evaluate your baby when other tests, such as the biophysical profile, have shown that there may be a problem. This test may be done:  Any time you are in labor.  Between weeks 32 and 34 of your pregnancy.  Later, if necessary. Modified biophysical profile A modified biophysical profile is a two-part test. You will have:  An ultrasound exam,   to check how much fluid is surrounding your baby inside of your womb (amniotic fluid).  A non-stress test, to check your baby's heart rate. The results will determine whether a full biophysical profile may be needed. This test is usually done late in your final 3 months (third trimester) of pregnancy. Umbilical artery Doppler velocimetry Umbilical artery Doppler velocimetry uses sound waves to measure the flow of blood between you and your baby. This test:  Measures the amount  of blood flow through the cord that attaches your baby to your womb (umbilical cord).  Measures the speed of the blood flow. You likely only need this test to check your baby's condition if you have a high-risk pregnancy. All types of monitoring aim to protect your health and that of your baby. The best way to have a healthy pregnancy and a healthy baby is to learn as much as you can about your pregnancy and to work closely with all your health care providers. This information is not intended to replace advice given to you by your health care provider. Make sure you discuss any questions you have with your health care provider. Document Revised: 08/09/2017 Document Reviewed: 06/14/2016 Elsevier Patient Education  2020 [employer].  Second Trimester of Pregnancy  The second trimester is from week 14 through week 27 (month 4 through 6). This is often the time in pregnancy that you feel your best. Often times, morning sickness has lessened or quit. You may have more energy, and you may get hungry more often. Your unborn baby is growing rapidly. At the end of the sixth month, he or she is about 9 inches long and weighs about 1 pounds. You will likely feel the baby move between 18 and 20 weeks of pregnancy. Follow these instructions at home: Medicines  Take over-the-counter and prescription medicines only as told by your doctor. Some medicines are safe and some medicines are not safe during pregnancy.  Take a prenatal vitamin that contains at least 600 micrograms (mcg) of folic acid.  If you have trouble pooping (constipation), take medicine that will make your stool soft (stool softener) if your doctor approves. Eating and drinking   Eat regular, healthy meals.  Avoid raw meat and uncooked cheese.  If you get low calcium from the food you eat, talk to your doctor about taking a daily calcium supplement.  Avoid foods that are high in fat and sugars, such as fried and sweet foods.  If you  feel sick to your stomach (nauseous) or throw up (vomit): ? Eat 4 or 5 small meals a day instead of 3 large meals. ? Try eating a few soda crackers. ? Drink liquids between meals instead of during meals.  To prevent constipation: ? Eat foods that are high in fiber, like fresh fruits and vegetables, whole grains, and beans. ? Drink enough fluids to keep your pee (urine) clear or pale yellow. Activity  Exercise only as told by your doctor. Stop exercising if you start to have cramps.  Do not exercise if it is too hot, too humid, or if you are in a place of great height (high altitude).  Avoid heavy lifting.  Wear low-heeled shoes. Sit and stand up straight.  You can continue to have sex unless your doctor tells you not to. Relieving pain and discomfort  Wear a good support bra if your breasts are tender.  Take warm water baths (sitz baths) to soothe pain or discomfort caused by hemorrhoids. Use hemorrhoid cream if  your doctor approves.  Rest with your legs raised if you have leg cramps or low back pain.  If you develop puffy, bulging veins (varicose veins) in your legs: ? Wear support hose or compression stockings as told by your doctor. ? Raise (elevate) your feet for 15 minutes, 3-4 times a day. ? Limit salt in your food. Prenatal care  Write down your questions. Take them to your prenatal visits.  Keep all your prenatal visits as told by your doctor. This is important. Safety  Wear your seat belt when driving.  Make a list of emergency phone numbers, including numbers for family, friends, the hospital, and police and fire departments. General instructions  Ask your doctor about the right foods to eat or for help finding a counselor, if you need these services.  Ask your doctor about local prenatal classes. Begin classes before month 6 of your pregnancy.  Do not use hot tubs, steam rooms, or saunas.  Do not douche or use tampons or scented sanitary pads.  Do not  cross your legs for long periods of time.  Visit your dentist if you have not done so. Use a soft toothbrush to brush your teeth. Floss gently.  Avoid all smoking, herbs, and alcohol. Avoid drugs that are not approved by your doctor.  Do not use any products that contain nicotine or tobacco, such as cigarettes and e-cigarettes. If you need help quitting, ask your doctor.  Avoid cat litter boxes and soil used by cats. These carry germs that can cause birth defects in the baby and can cause a loss of your baby (miscarriage) or stillbirth. Contact a doctor if:  You have mild cramps or pressure in your lower belly.  You have pain when you pee (urinate).  You have bad smelling fluid coming from your vagina.  You continue to feel sick to your stomach (nauseous), throw up (vomit), or have watery poop (diarrhea).  You have a nagging pain in your belly area.  You feel dizzy. Get help right away if:  You have a fever.  You are leaking fluid from your vagina.  You have spotting or bleeding from your vagina.  You have severe belly cramping or pain.  You lose or gain weight rapidly.  You have trouble catching your breath and have chest pain.  You notice sudden or extreme puffiness (swelling) of your face, hands, ankles, feet, or legs.  You have not felt the baby move in over an hour.  You have severe headaches that do not go away when you take medicine.  You have trouble seeing. Summary  The second trimester is from week 14 through week 27 (months 4 through 6). This is often the time in pregnancy that you feel your best.  To take care of yourself and your unborn baby, you will need to eat healthy meals, take medicines only if your doctor tells you to do so, and do activities that are safe for you and your baby.  Call your doctor if you get sick or if you notice anything unusual about your pregnancy. Also, call your doctor if you need help with the right food to eat, or if you want  to know what activities are safe for you. This information is not intended to replace advice given to you by your health care provider. Make sure you discuss any questions you have with your health care provider. Document Revised: 03/09/2019 Document Reviewed: 12/21/2016 Elsevier Patient Education  [place].

## 2020-05-13 NOTE — MAU Note (Signed)
Pt reports not feeling baby move for about 3-5 days. FHR 155 in triage. Was "trying to wait it out because thought baby was sleeping." Called after hours number and was told to come in. Pt denies pain, vaginal bleeding, or LOF.

## 2020-05-13 NOTE — MAU Provider Note (Signed)
Chief Complaint:  Decreased Fetal Movement   First Provider Initiated Contact with Patient 05/13/20 2325     HPI  HPI: Renee Crawford is a 19 y.o. G1P0 at 73w1dwho presents to maternity admissions reporting decreased fetal movement for several days.  Denies pain or contractions. . She denies LOF, vaginal bleeding, vaginal itching/burning, urinary symptoms, h/a, dizziness, n/v, diarrhea, constipation or fever/chills.    RN Note: Pt reports not feeling baby move for about 3-5 days. FHR 155 in triage. Was "trying to wait it out because thought baby was sleeping." Called after hours number and was told to come in. Pt denies pain, vaginal bleeding, or LOF.  Past Medical History: Past Medical History:  Diagnosis Date  . Anemia   . Infection    UTI    Past obstetric history: OB History  Gravida Para Term Preterm AB Living  1            SAB TAB Ectopic Multiple Live Births               # Outcome Date GA Lbr Len/2nd Weight Sex Delivery Anes PTL Lv  1 Current             Past Surgical History: Past Surgical History:  Procedure Laterality Date  . NO PAST SURGERIES      Family History: Family History  Problem Relation Age of Onset  . Asthma Mother   . Healthy Father     Social History: Social History   Tobacco Use  . Smoking status: Former Smoker    Types: Cigarettes    Quit date: 01/28/2020    Years since quitting: 0.2  . Smokeless tobacco: Never Used  Vaping Use  . Vaping Use: Former  . Quit date: 01/28/2020  Substance Use Topics  . Alcohol use: No  . Drug use: No    Allergies:  Allergies  Allergen Reactions  . Sulfa Antibiotics Anaphylaxis and Hives  . Amoxicillin-Pot Clavulanate Hives and Other (See Comments)  . Latex Rash    Meds:  Medications Prior to Admission  Medication Sig Dispense Refill Last Dose  . acetaminophen (TYLENOL) 500 MG tablet Take 500 mg by mouth every 6 (six) hours as needed.     . Blood Pressure Monitor MISC For regular home bp  monitoring during pregnancy 1 each 0   . diphenhydrAMINE HCl (BENADRYL PO) Take by mouth as needed.     . Prenatal Vit-DSS-Fe Fum-FA (PRENATAL 19) tablet Take 1 tablet by mouth daily. 30 tablet 1     I have reviewed patient's Past Medical Hx, Surgical Hx, Family Hx, Social Hx, medications and allergies.   ROS:  Review of Systems  Constitutional: Negative for activity change, appetite change, chills and fever.  Respiratory: Negative for shortness of breath.   Gastrointestinal: Negative for abdominal pain, diarrhea, nausea and rectal pain.  Genitourinary: Negative for pelvic pain and vaginal bleeding.  Musculoskeletal: Negative for back pain.  Neurological: Negative for dizziness.   Other systems negative  Physical Exam   Patient Vitals for the past 24 hrs:  BP Temp Pulse Resp SpO2 Height Weight  05/13/20 2256 111/61 -- 86 -- 98 % -- --  05/13/20 2243 109/67 98.2 F (36.8 C) 91 18 99 % -- --  05/13/20 2240 -- -- -- -- -- 5\' 4"  (1.626 m) 96.9 kg   Constitutional: Well-developed, well-nourished female in no acute distress.  Cardiovascular: normal rate and rhythm Respiratory: normal effort, clear to auscultation bilaterally GI: Abd soft, non-tender, gravid  appropriate for gestational age.   No rebound or guarding. MS: Extremities nontender, no edema, normal ROM Neurologic: Alert and oriented x 4.  GU: Neg CVAT.  PELVIC EXAM: deferred  FHT:  Baseline 140-145 , moderate variability, accelerations present, no decelerations  Reassuring for gestational age. Movement audible but patient does not feel it.  Contractions: none   Labs: No results found for this or any previous visit (from the past 24 hour(s)). A/Positive/-- (03/08 1616)  Imaging:  No results found.  MAU Course/MDM: NST reviewed   Fetal heart rate pattern is reassuring and reactive, even for young gestational age Extended monitoring for a while for patient reassurance.  Discussed lack of sensation of fetal movement  may be related to position of fetus in uterus.   Treatments in MAU included EFM.    Assessment: [place] IUP at [redacted]w[redacted]d Decreased perception of fetal movement Reactive/Reassuring fetal heart rate tracing  Plan: Discharge home Preterm Labor precautions and fetal kick counts Follow up in Office for prenatal visits   Encouraged to return here or to other Urgent Care/ED if she develops worsening of symptoms, increase in pain, fever, or other concerning symptoms.   Pt stable at time of discharge.  Hansel Feinstein CNM, MSN Certified Nurse-Midwife 05/13/2020 11:25 PM

## 2020-05-22 ENCOUNTER — Encounter: Payer: Medicaid Other | Admitting: Women's Health

## 2020-05-22 ENCOUNTER — Other Ambulatory Visit: Payer: Medicaid Other

## 2020-06-04 ENCOUNTER — Other Ambulatory Visit: Payer: Self-pay | Admitting: Women's Health

## 2020-06-04 ENCOUNTER — Telehealth: Payer: Self-pay | Admitting: Women's Health

## 2020-06-04 ENCOUNTER — Telehealth: Payer: Self-pay | Admitting: *Deleted

## 2020-06-04 MED ORDER — PRENATAL 19 PO TABS: 1.0000 | ORAL_TABLET | Freq: Every day | ORAL | 11 refills | Status: DC

## 2020-06-04 NOTE — Telephone Encounter (Signed)
Unable to leave VM.  Will you send in refill for PNV?

## 2020-06-04 NOTE — Telephone Encounter (Signed)
Patient called stating that she would like for one of our providers to call her ina refill of her Prenatal medication. Please contact pt

## 2020-06-11 ENCOUNTER — Ambulatory Visit (INDEPENDENT_AMBULATORY_CARE_PROVIDER_SITE_OTHER): Payer: Medicaid Other | Admitting: Obstetrics and Gynecology

## 2020-06-11 ENCOUNTER — Other Ambulatory Visit: Payer: Medicaid Other

## 2020-06-11 VITALS — BP 123/80 | HR 101 | Wt 208.8 lb

## 2020-06-11 DIAGNOSIS — Z3403 Encounter for supervision of normal first pregnancy, third trimester: Secondary | ICD-10-CM

## 2020-06-11 DIAGNOSIS — Z131 Encounter for screening for diabetes mellitus: Secondary | ICD-10-CM

## 2020-06-11 DIAGNOSIS — Z1389 Encounter for screening for other disorder: Secondary | ICD-10-CM

## 2020-06-11 DIAGNOSIS — Z331 Pregnant state, incidental: Secondary | ICD-10-CM

## 2020-06-11 DIAGNOSIS — Z3A30 30 weeks gestation of pregnancy: Secondary | ICD-10-CM

## 2020-06-11 LAB — POCT URINALYSIS DIPSTICK OB
Blood, UA: NEGATIVE
Glucose, UA: NEGATIVE
Ketones, UA: NEGATIVE
Leukocytes, UA: NEGATIVE
Nitrite, UA: NEGATIVE
POC,PROTEIN,UA: NEGATIVE

## 2020-06-11 NOTE — Patient Instructions (Addendum)
https://www.cdc.gov/vaccines/hcp/vis/vis-statements/tdap.pdf">  Tdap (Tetanus, Diphtheria, Pertussis) Vaccine: What You Need to Know 1. Why get vaccinated? Tdap vaccine can prevent tetanus, diphtheria, and pertussis. Diphtheria and pertussis spread from person to person. Tetanus enters the body through cuts or wounds.  TETANUS (T) causes painful stiffening of the muscles. Tetanus can lead to serious health problems, including being unable to open the mouth, having trouble swallowing and breathing, or death.  DIPHTHERIA (D) can lead to difficulty breathing, heart failure, paralysis, or death.  PERTUSSIS (aP), also known as "whooping cough," can cause uncontrollable, violent coughing which makes it hard to breathe, eat, or drink. Pertussis can be extremely serious in babies and young children, causing pneumonia, convulsions, brain damage, or death. In teens and adults, it can cause weight loss, loss of bladder control, passing out, and rib fractures from severe coughing. 2. Tdap vaccine Tdap is only for children 7 years and older, adolescents, and adults.  Adolescents should receive a single dose of Tdap, preferably at age 53 or 35 years. Pregnant women should get a dose of Tdap during every pregnancy, to protect the newborn from pertussis. Infants are most at risk for severe, life-threatening complications from pertussis. Adults who have never received Tdap should get a dose of Tdap. Also, adults should receive a booster dose every 10 years, or earlier in the case of a severe and dirty wound or burn. Booster doses can be either Tdap or Td (a different vaccine that protects against tetanus and diphtheria but not pertussis). Tdap may be given at the same time as other vaccines. 3. Talk with your health care provider Tell your vaccine provider if the person getting the vaccine:  Has had an allergic reaction after a previous dose of any vaccine that protects against tetanus, diphtheria, or pertussis,  or has any severe, life-threatening allergies.  Has had a coma, decreased level of consciousness, or prolonged seizures within 7 days after a previous dose of any pertussis vaccine (DTP, DTaP, or Tdap).  Has seizures or another nervous system problem.  Has ever had Guillain-Barr Syndrome (also called GBS).  Has had severe pain or swelling after a previous dose of any vaccine that protects against tetanus or diphtheria. In some cases, your health care provider may decide to postpone Tdap vaccination to a future visit.  People with minor illnesses, such as a cold, may be vaccinated. People who are moderately or severely ill should usually wait until they recover before getting Tdap vaccine.  Your health care provider can give you more information. 4. Risks of a vaccine reaction  Pain, redness, or swelling where the shot was given, mild fever, headache, feeling tired, and nausea, vomiting, diarrhea, or stomachache sometimes happen after Tdap vaccine. People sometimes faint after medical procedures, including vaccination. Tell your provider if you feel dizzy or have vision changes or ringing in the ears.  As with any medicine, there is a very remote chance of a vaccine causing a severe allergic reaction, other serious injury, or death. 5. What if there is a serious problem? An allergic reaction could occur after the vaccinated person leaves the clinic. If you see signs of a severe allergic reaction (hives, swelling of the face and throat, difficulty breathing, a fast heartbeat, dizziness, or weakness), call 9-1-1 and get the person to the nearest hospital. For other signs that concern you, call your health care provider.  Adverse reactions should be reported to the Vaccine Adverse Event Reporting System (VAERS). Your health care provider will usually file this report,  or you can do it yourself. Visit the VAERS website at www.vaers.LAgents.no or call (724)729-1628. VAERS is only for reporting  reactions, and VAERS staff do not give medical advice. 6. The National Vaccine Injury Compensation Program The [employer] Vaccine Injury Compensation Program (VICP) is a federal program that was created to compensate people who may have been injured by certain vaccines. Visit the VICP website at SpiritualWord.at or call 506-602-1290 to learn about the program and about filing a claim. There is a time limit to file a claim for compensation. 7. How can I learn more?  Ask your health care provider.  Call your local or state health department.  Contact the Centers for Disease Control and Prevention (CDC): ? Call 639-003-8901 (1-800-CDC-INFO) or ? Visit CDC's website at PicCapture.uy Vaccine Information Statement Tdap (Tetanus, Diphtheria, Pertussis) Vaccine (02/28/2019) This information is not intended to replace advice given to you by your health care provider. Make sure you discuss any questions you have with your health care provider. Document Revised: 03/09/2019 Document Reviewed: 03/12/2019          CHILDBIRTH CLASSES  [facility] Call to Register: [phone] or [phone] or Register Online: [email or web address]  THESE CLASSES FILL UP VERY QUICKLY, SO SIGN UP AS SOON AS YOU CAN!!!  *Please visit [facility]'s pregnancy website at [email or web address]*  Option 1: Birth & Baby Series . This series of 3 weekly classes helps you and your labor partner prepare for childbirth at [facility]. . Reviews newborn care, labor & birth, pain management, and comfort techniques . Maternity Care Center Tour of [facility] is included. . Cost: $60 per couple for insured or self-pay, $30 per couple for Medicaid  Option 2: Weekend Birth & [address] . This class is a weekend version of our Birth & Baby series. It is designed for parents who have a difficult time fitting several weeks of classes into their schedule.  . Maternity Care Center  Tour of [facility] is included.  . Friday 6:30pm-8:30pm, Saturday 9am-4pm . Cost: $75 per couple for insured or self-pay, $30 per couple for Medicaid  Option 3: Natural Childbirth . This series of 5 weekly classes is for expectant parents who want to learn and practice natural methods of coping with the process of labor and childbirth. . Maternity Care Center Tour of [facility] is included. . Cost: $75 per couple for insured or self-pay, $30 per couple for Medicaid  Option 4: Online Birth & Baby . This online class offers you the freedom to complete a Birth & Baby series in the comfort of your own home. The flexibility of this option allows you to review sections at your own place, at times convenient to you and your support people.  . Cost: $60 for 60 days of online access    Other Available Classes Baby & Me Enjoy this time to discuss newborn & infant parenting topics and family adjustment issues with other new mothers in a relaxed environment. Each week brings a new speaker or baby-centered activity. We encourage mothers and their babies (birth to crawling) to join [place] every Thursday in the [facility] at 11:00 am. You are welcome to visit this group even if you haven't delivered yet! It's wonderful to make new friends early and watch other moms interact with their babies. No registration or fee.   Big Brother/ Big Sister Let your children share in the joy of a new brother or sister in this special class designed just for them. This  class is designed for children ages 2-6, but any age is welcome. Please register each child individually.  Breastfeeding Support Group This group is a mother-to-mother support circle where moms have the opportunity to share their breastfeeding experiences. A breastfeeding Support nurse is present for questions and concerns. Meets each Tuesday at 11:00 am. No fee or registration.  Breastfeeding Your Baby Breastfeeding is a  special time for mother and child. This class will help you feel ready to begin this important relationship. Your partner is encouraged to attend with you.   Caring For Baby This class is for expectant  and adoptive parents who want to learn and practice the most up-to-date newborn care for their babies. Register only the mom-to-be and your partner can come with you. (Note: This class is included in the Birth & Baby series and the Weekend Birth & Baby classes.)  Comfort Techniques & Tour This 2-hour interactive class will provide you the opportunity to learn & practice hands-on techniques with your partner that can help relieve some discomfort of labor and encourage your baby to rotate toward the best position for birth. A tour of the [facility] is included.   Daddy [employer] This course offers Dads-to-be the tools and knowledge needed to feel confident on their journey to becoming new fathers.  Grandparent Love Expecting a grandbaby? Learn about the latest infant care and safety recommendation and ways to support your own child as he or she transitions into the parenting role.   Infant and Child CPR Parents, grandparents, babysitters, and friends learn Cardio-Pulmonary Resuscitation skills for infants and children. Register each participant individually. (Note: This Family & Friends program does not offer certification.)  Marvelous Multiples Expecting twins, triplets, or more? This class covers the differences in labor, birth, parenting, and breastfeeding issues that face multiples' parents. NICU tour is included.   Mom Talk This mom-led group offers support and connection to mothers as they journey through the adjustments and struggles of that sometimes overwhelming first year after the birth of a child. A member of our [facility] staff will be present to share resources and additional support if needed, as you care for yourself and baby. You are welcome to  visit the group before you deliver! It's wonderful to meet new friends early and watch other moms interact with their babies. It's held at [facility] at 10:00 am each Tuesday morning and 6:00 pm each Thursday evening. Babies (birth to crawling) welcome. No registration or fee.   Waterbirth Classes Interested in a waterbirth? This informational class will help you discover whether waterbirth is the right fir for you.   [address] [employer] Tour View a virtual tour of [facility]. In-person tours are available for participants of childbirth education classes.   Elsevier Patient Education  [employer].

## 2020-06-11 NOTE — Progress Notes (Signed)
Patient ID: Renee Crawford, female   DOB: 2001-03-27, 19 y.o.   MRN: [id number]   LOW-RISK PREGNANCY VISIT Patient name: Renee Crawford MRN [id number]  Date of birth: 03-14-2001 Chief Complaint:   Routine Prenatal Visit  History of Present Illness:   Jacob Chamblee is a 19 y.o. G1P0 female at [redacted]w[redacted]d with an Estimated Date of Delivery: 08/18/20 being seen today for ongoing management of a low-risk pregnancy.  Today she reports no complaints. She has not looked into childbirth classes yet. Her partner is at work today. She lives in [place] and is about 5-10 minutes from the hospital. She has an appointment for a 3D ultrasound at [place] next week.  Contractions: Not present. Vag. Bleeding: None.  Movement: Present. denies leaking of fluid. Review of Systems:   Pertinent items are noted in HPI Denies abnormal vaginal discharge w/ itching/odor/irritation, headaches, visual changes, shortness of breath, chest pain, abdominal pain, severe nausea/vomiting, or problems with urination or bowel movements unless otherwise stated above. Pertinent History Reviewed:  Reviewed past medical,surgical, social, obstetrical and family history.  Reviewed problem list, medications and allergies. Physical Assessment:   Vitals:   06/11/20 0901  BP: 123/80  Pulse: (!) 101  Weight: 208 lb 12.8 oz (94.7 kg)  Body mass index is 35.84 kg/m.        Physical Examination:   General appearance: Well appearing, and in no distress  Mental status: Alert, oriented to person, place, and time  Skin: Warm & dry  Cardiovascular: Normal heart rate noted  Respiratory: Normal respiratory effort, no distress  Abdomen: Soft, gravid, nontender  Pelvic: Cervical exam deferred         Extremities: Edema: None  Fetal Status: Fetal Heart Rate (bpm): 151 Fundal Height: 31 cm Movement: Present    Results for orders placed or performed in visit on 06/11/20 (from the past 24 hour(s))  POC Urinalysis Dipstick OB    Collection Time: 06/11/20  8:59 AM  Result Value Ref Range   Color, UA     Clarity, UA     Glucose, UA Negative Negative   Bilirubin, UA     Ketones, UA n    Spec Grav, UA     Blood, UA n    pH, UA     POC,PROTEIN,UA Negative Negative, Trace, Small (1+), Moderate (2+), Large (3+), 4+   Urobilinogen, UA     Nitrite, UA n    Leukocytes, UA Negative Negative   Appearance     Odor      Assessment & Plan:  1) Low-risk pregnancy G1P0 at [redacted]w[redacted]d with an Estimated Date of Delivery: 08/18/20   Plan:  Continue routine obstetrical care  Meds: No orders of the defined types were placed in this encounter.  Labs/procedures today: PN2  Reviewed: Preterm labor symptoms and general obstetric precautions including but not limited to vaginal bleeding, contractions, leaking of fluid and fetal movement were reviewed in detail with the patient.  All questions were answered  Follow-up: Return in about 2 weeks (around 06/25/2020) for LROB.  Info on childbirth classes given Info on TDAP given  By signing my name below, I, Pietro Cassis, attest that this documentation has been prepared under the direction and in the presence of Tilda Burrow, MD. Electronically Signed: Pietro Cassis, Medical Scribe. 06/11/20. 9:20 AM.  I personally performed the services described in this documentation, which was SCRIBED in my presence. The recorded information has been reviewed and considered accurate. It has been edited as necessary during  review. Tilda Burrow, MD

## 2020-06-12 LAB — CBC
Hematocrit: 37.7 % (ref 34.0–46.6)
Hemoglobin: 13 g/dL (ref 11.1–15.9)
MCH: 30 pg (ref 26.6–33.0)
MCHC: 34.5 g/dL (ref 31.5–35.7)
MCV: 87 fL (ref 79–97)
Platelets: 285 10*3/uL (ref 150–450)
RBC: 4.33 x10E6/uL (ref 3.77–5.28)
RDW: 12.2 % (ref 11.7–15.4)
WBC: 17.7 10*3/uL — ABNORMAL HIGH (ref 3.4–10.8)

## 2020-06-12 LAB — GLUCOSE TOLERANCE, 2 HOURS W/ 1HR
Glucose, 1 hour: 90 mg/dL (ref 65–179)
Glucose, 2 hour: 105 mg/dL (ref 65–152)
Glucose, Fasting: 72 mg/dL (ref 65–91)

## 2020-06-12 LAB — HIV ANTIBODY (ROUTINE TESTING W REFLEX): HIV Screen 4th Generation wRfx: NONREACTIVE

## 2020-06-12 LAB — ANTIBODY SCREEN: Antibody Screen: NEGATIVE

## 2020-06-12 LAB — RPR: RPR Ser Ql: NONREACTIVE

## 2020-06-25 ENCOUNTER — Encounter: Payer: Medicaid Other | Admitting: Advanced Practice Midwife

## 2020-06-26 ENCOUNTER — Other Ambulatory Visit: Payer: Self-pay

## 2020-06-26 ENCOUNTER — Ambulatory Visit (HOSPITAL_COMMUNITY)
Admission: EM | Admit: 2020-06-26 | Discharge: 2020-06-26 | Disposition: A | Discharge status: Home / Self Care | Payer: Medicaid Other | Attending: Emergency Medicine | Admitting: Emergency Medicine

## 2020-06-26 ENCOUNTER — Encounter (HOSPITAL_COMMUNITY): Payer: Self-pay

## 2020-06-26 DIAGNOSIS — Z20822 Contact with and (suspected) exposure to covid-19: Secondary | ICD-10-CM | POA: Diagnosis not present

## 2020-06-26 DIAGNOSIS — Z91040 Latex allergy status: Secondary | ICD-10-CM | POA: Insufficient documentation

## 2020-06-26 DIAGNOSIS — O99413 Diseases of the circulatory system complicating pregnancy, third trimester: Secondary | ICD-10-CM | POA: Insufficient documentation

## 2020-06-26 DIAGNOSIS — J029 Acute pharyngitis, unspecified: Secondary | ICD-10-CM | POA: Diagnosis not present

## 2020-06-26 DIAGNOSIS — Z880 Allergy status to penicillin: Secondary | ICD-10-CM | POA: Diagnosis not present

## 2020-06-26 DIAGNOSIS — Z87891 Personal history of nicotine dependence: Secondary | ICD-10-CM | POA: Diagnosis not present

## 2020-06-26 DIAGNOSIS — I479 Paroxysmal tachycardia, unspecified: Secondary | ICD-10-CM | POA: Insufficient documentation

## 2020-06-26 DIAGNOSIS — Z3A32 32 weeks gestation of pregnancy: Secondary | ICD-10-CM

## 2020-06-26 DIAGNOSIS — O99513 Diseases of the respiratory system complicating pregnancy, third trimester: Secondary | ICD-10-CM | POA: Diagnosis not present

## 2020-06-26 DIAGNOSIS — Z882 Allergy status to sulfonamides status: Secondary | ICD-10-CM | POA: Diagnosis not present

## 2020-06-26 DIAGNOSIS — J069 Acute upper respiratory infection, unspecified: Secondary | ICD-10-CM | POA: Diagnosis not present

## 2020-06-26 DIAGNOSIS — R0981 Nasal congestion: Secondary | ICD-10-CM | POA: Diagnosis not present

## 2020-06-26 LAB — SARS CORONAVIRUS 2 (TAT 6-24 HRS): SARS Coronavirus 2: NEGATIVE

## 2020-06-26 NOTE — ED Triage Notes (Signed)
Pt presents with cough, body aches, back pain, nasal congestion, chills x 2 days.

## 2020-06-26 NOTE — Discharge Instructions (Signed)
Rest,push fluids. Continue prenatal vitamins, may use OTC tylenol as label directed for discomfort. Follow up with OB-call today for follow up

## 2020-06-26 NOTE — ED Provider Notes (Signed)
[facility]    CSN: [id number] Arrival date & time: 06/26/20  1114      History   Chief Complaint Chief Complaint  Patient presents with  . Cough  . Chills  . Generalized Body Aches  . Headache  . Nasal Congestion    HPI Renee Crawford is a 19 y.o. female.   Pt reports 2 day history of URI type symptoms(cough,nasal congestion, unknown sickness exposure) is nopt vaccinated for COVID is [redacted] weeks pregnant. Pt has hx of HCC(paroxysmal tachycardia). Taking po's without issue.  The history is provided by the patient. No language interpreter was used.    Past Medical History:  Diagnosis Date  . Anemia   . Infection    UTI    Patient Active Problem List   Diagnosis Date Noted  . URI, acute 06/26/2020  . [redacted] weeks gestation of pregnancy 06/26/2020  . Nasal congestion 06/26/2020  . Frequent UTI 03/10/2020  . Supervision of normal first pregnancy 02/04/2020  . Paroxysmal tachycardia (HCC) 01/02/2018  . 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 NO PAST SURGERIES      OB History    Gravida  1   Para      Term      Preterm      AB      Living        SAB      TAB      Ectopic      Multiple      Live Births               Home Medications    Prior to Admission medications   Medication Sig Start Date End Date Taking? Authorizing Provider  acetaminophen (TYLENOL) 500 MG tablet Take 500 mg by mouth every 6 (six) hours as needed.    [provider]  Blood Pressure Monitor MISC For regular home bp monitoring during pregnancy 02/04/20   Cresenzo-Dishmon, Scarlette Calico, CNM  diphenhydrAMINE HCl (BENADRYL PO) Take by mouth as needed.    [provider]  Prenatal Vit-DSS-Fe Fum-FA (PRENATAL 19) tablet Take 1 tablet by mouth daily. Patient not taking: Reported on 06/11/2020 06/04/20   Cheral Marker, CNM    Family History Family History    Problem Relation Age of Onset  . Asthma Mother   . Healthy Father     Social History Social History   Tobacco Use  . Smoking status: Former Smoker    Types: Cigarettes    Quit date: 01/28/2020    Years since quitting: 0.4  . Smokeless tobacco: Never Used  Vaping Use  . Vaping Use: Former  . Quit date: 01/28/2020  Substance Use Topics  . Alcohol use: No  . Drug use: No     Allergies   Sulfa antibiotics, Amoxicillin-pot clavulanate, and Latex   Review of Systems Review of Systems  Constitutional: Negative for chills and fever.  HENT: Positive for congestion. Negative for ear pain, rhinorrhea and sore throat.   Eyes: Negative.   Respiratory: Positive for cough. Negative for wheezing.   Cardiovascular: Negative for chest pain.  Gastrointestinal: Negative for abdominal pain, nausea and vomiting.  Endocrine: Negative.   Genitourinary: Negative for dysuria.  Musculoskeletal: Negative for myalgias.  Skin: Negative for rash.  Allergic/Immunologic: Negative.   Neurological: Negative for headaches.  Hematological: Negative.   Psychiatric/Behavioral: Negative.   All other systems reviewed and are negative.  Physical Exam Triage Vital Signs ED Triage Vitals  Enc Vitals Group     BP 06/26/20 1256 113/73     Pulse Rate 06/26/20 1256 (!) 137     Resp 06/26/20 1256 18     Temp 06/26/20 1256 98.3 F (36.8 C)     Temp Source 06/26/20 1256 Oral     SpO2 06/26/20 1256 99 %     Weight --      Height --      Head Circumference --      Peak Flow --      Pain Score 06/26/20 1255 0     Pain Loc --      Pain Edu? --      Excl. in GC? --    No data found.  Updated Vital Signs BP 113/73 (BP Location: Left Arm)   Pulse (!) 137   Temp 98.3 F (36.8 C) (Oral)   Resp 18   LMP 12/11/2019 (Exact Date)   SpO2 99%     Physical Exam Vitals and nursing note reviewed.  Constitutional:      General: She is not in acute distress.    Appearance: She is well-developed.  HENT:      Head: Normocephalic.     Right Ear: Tympanic membrane is retracted.     Left Ear: Tympanic membrane is retracted.     Nose: Congestion present.     Mouth/Throat:     Mouth: Mucous membranes are moist.  Eyes:     General: Lids are normal.     Conjunctiva/sclera: Conjunctivae normal.     Pupils: Pupils are equal, round, and reactive to light.  Neck:     Trachea: No tracheal deviation.  Cardiovascular:     Rate and Rhythm: Regular rhythm. Tachycardia present.     Pulses: Normal pulses.     Heart sounds: Normal heart sounds. No murmur heard.   Pulmonary:     Effort: Pulmonary effort is normal.     Breath sounds: Normal breath sounds.  Abdominal:     General: Bowel sounds are normal.     Palpations: Abdomen is soft.     Tenderness: There is no abdominal tenderness.  Musculoskeletal:        General: Normal range of motion.     Cervical back: Normal range of motion.  Lymphadenopathy:     Cervical: No cervical adenopathy.  Skin:    General: Skin is warm and dry.     Findings: No rash.  Neurological:     Mental Status: She is alert and oriented to person, place, and time.     GCS: GCS eye subscore is 4. GCS verbal subscore is 5. GCS motor subscore is 6.  Psychiatric:        Speech: Speech normal.        Behavior: Behavior normal. Behavior is cooperative.      UC Treatments / Results  Labs (all labs ordered are listed, but only abnormal results are displayed) Labs Reviewed  SARS CORONAVIRUS 2 (TAT 6-24 HRS)    EKG   Radiology No results found.  Procedures Procedures (including critical care time)  Medications Ordered in UC Medications - No data to display  Initial Impression / Assessment and Plan / UC Course  I have reviewed the triage vital signs and the nursing notes.  Pertinent labs & imaging results that were available during my care of the patient were reviewed by me and considered in my medical decision making (see  chart for details).      Final  Clinical Impressions(s) / UC Diagnoses   Final diagnoses:  URI, acute  [redacted] weeks gestation of pregnancy  Nasal congestion     Discharge Instructions     Rest,push fluids. Continue prenatal vitamins, may use OTC tylenol as label directed for discomfort. Follow up with OB-call today for follow up    ED Prescriptions    None     I have reviewed the PDMP during this encounter.   Clancy Gourd, NP 06/26/20 1416

## 2020-06-27 ENCOUNTER — Telehealth: Payer: Self-pay | Admitting: Women's Health

## 2020-06-27 NOTE — Telephone Encounter (Signed)
Patient called the office stating that she is pregnant and is experiencing cramping of her lower abdominal area. Pt states that she is not bleeding, no fluid leakage and no discharge. Please contact pt

## 2020-06-27 NOTE — Telephone Encounter (Signed)
Attempted to call patient back, no answer. Will send mychart message.

## 2020-07-10 ENCOUNTER — Encounter: Payer: Self-pay | Admitting: Obstetrics and Gynecology

## 2020-07-10 ENCOUNTER — Ambulatory Visit (INDEPENDENT_AMBULATORY_CARE_PROVIDER_SITE_OTHER): Payer: Medicaid Other | Admitting: Obstetrics and Gynecology

## 2020-07-10 VITALS — BP 111/67 | HR 98 | Wt 216.0 lb

## 2020-07-10 DIAGNOSIS — Z331 Pregnant state, incidental: Secondary | ICD-10-CM | POA: Diagnosis not present

## 2020-07-10 DIAGNOSIS — Z1389 Encounter for screening for other disorder: Secondary | ICD-10-CM | POA: Diagnosis not present

## 2020-07-10 DIAGNOSIS — Z3403 Encounter for supervision of normal first pregnancy, third trimester: Secondary | ICD-10-CM | POA: Diagnosis not present

## 2020-07-10 DIAGNOSIS — Z3A34 34 weeks gestation of pregnancy: Secondary | ICD-10-CM

## 2020-07-10 LAB — POCT URINALYSIS DIPSTICK OB
Blood, UA: NEGATIVE
Glucose, UA: NEGATIVE
Ketones, UA: NEGATIVE
Leukocytes, UA: NEGATIVE
Nitrite, UA: NEGATIVE
POC,PROTEIN,UA: NEGATIVE

## 2020-07-10 NOTE — Patient Instructions (Signed)
CHILDBIRTH CLASSES ° °[facility] °Call to Register: [phone] or [phone] or Register Online: [email or web address] ° °THESE CLASSES FILL UP VERY QUICKLY, SO SIGN UP AS SOON AS YOU CAN!!! ° °*Please visit [facility] pregnancy website at [email or web address]* ° °Option 1: Birth & Baby Series °• This series of 3 weekly classes helps you and your labor partner prepare for childbirth at [facility]. °• Reviews newborn care, labor & birth, pain management, and comfort techniques °• Maternity Care Center Tour of [facility] is included. °• Cost: $60 per couple for insured or self-pay, $30 per couple for Medicaid ° °Option 2: Weekend Birth & Baby °• This class is a weekend version of our Birth & Baby series. It is designed for parents who have a difficult time fitting several weeks of classes into their schedule.  °• Maternity Care Center Tour of [facility] is included.  °• Friday 6:30pm-8:30pm, Saturday 9am-4pm °• Cost: $75 per couple for insured or self-pay, $30 per couple for Medicaid ° °Option 3: Natural Childbirth °• This series of 5 weekly classes is for expectant parents who want to learn and practice natural methods of coping with the process of labor and childbirth. °• Maternity Care Center Tour of [facility] is included. °• Cost: $75 per couple for insured or self-pay, $30 per couple for Medicaid ° °Option 4: Online Birth & Baby °• This online class offers you the freedom to complete a Birth & Baby series in the comfort of your own home. The flexibility of this option allows you to review sections at your own place, at times convenient to you and your support people.  °• Cost: $60 for 60 days of online access ° ° ° °Other Available Classes °Baby & Me °Enjoy this time to discuss newborn & infant parenting topics and family adjustment issues with other new mothers in a relaxed environment. Each week brings a new speaker or baby-centered activity. We encourage  mothers and their babies (birth to crawling) to join us every Thursday in the [facility] at 11:00 am. You are welcome to visit this group even if you haven't delivered yet! It's wonderful to make new friends early and watch other moms interact with their babies. No registration or fee.  ° °Big Brother/ Big Sister °Let your children share in the joy of a new brother or sister in this special class designed just for them. This class is designed for children ages 2-6, but any age is welcome. Please register each child individually. ° °Breastfeeding Support Group °This group is a mother-to-mother support circle where moms have the opportunity to share their breastfeeding experiences. A breastfeeding Support nurse is present for questions and concerns. Meets each Tuesday at 11:00 am. No fee or registration. ° °Breastfeeding Your Baby °Breastfeeding is a special time for mother and child. This class will help you feel ready to begin this important relationship. Your partner is encouraged to attend with you.  ° °Caring For Baby °This class is for expectant  and adoptive parents who want to learn and practice the most up-to-date newborn care for their babies. Register only the mom-to-be and your partner can come with you. (Note: This class is included in the Birth & Baby series and the Weekend Birth & Baby classes.) ° °Comfort Techniques & Tour °This 2-hour interactive class will provide you the opportunity to learn & practice hands-on techniques with your partner that can help relieve some discomfort of labor and encourage your baby to rotate toward   the best position for birth. A tour of the [facility] is included.  ° °Daddy Boot Camp °This course offers Dads-to-be the tools and knowledge needed to feel confident on their journey to becoming new fathers. ° °Grandparent Love °Expecting a grandbaby? Learn about the latest infant care and safety recommendation and ways to  support your own child as he or she transitions into the parenting role.  ° °Infant and Child CPR °Parents, grandparents, babysitters, and friends learn Cardio-Pulmonary Resuscitation skills for infants and children. Register each participant individually. (Note: This Family & Friends program does not offer certification.) ° °Marvelous Multiples °Expecting twins, triplets, or more? This class covers the differences in labor, birth, parenting, and breastfeeding issues that face multiples' parents. NICU tour is included.  ° °Mom Talk °This mom-led group offers support and connection to mothers as they journey through the adjustments and struggles of that sometimes overwhelming first year after the birth of a child. A member of our [facility] staff will be present to share resources and additional support if needed, as you care for yourself and baby. You are welcome to visit the group before you deliver! It's wonderful to meet new friends early and watch other moms interact with their babies. It's held at [facility] at 10:00 am each Tuesday morning and 6:00 pm each Thursday evening. Babies (birth to crawling) welcome. No registration or fee.  ° °Waterbirth Classes °Interested in a waterbirth? This informational class will help you discover whether waterbirth is the right fir for you.  ° °[facility] Virtual Maternity Tour °View a virtual tour of [facility]. In-person tours are available for participants of childbirth education classes.  ° °

## 2020-07-10 NOTE — Progress Notes (Signed)
Patient ID: Renee Crawford, female   DOB: 2001-05-18, 19 y.o.   MRN: [id number]   LOW-RISK PREGNANCY VISIT Patient name: Renee Crawford MRN [id number]  Date of birth: 09-01-2001 Chief Complaint:   Routine Prenatal Visit  History of Present Illness:   Renee Crawford is a 19 y.o. G1P0 female at [redacted]w[redacted]d with an Estimated Date of Delivery: 08/18/20 being seen today for ongoing management of a low-risk pregnancy. Today she reports no complaints.   Contractions: Irregular. Vag. Bleeding: None.  Movement: Present. denies leaking of fluid. Review of Systems:   Pertinent items are noted in HPI Denies abnormal vaginal discharge w/ itching/odor/irritation, headaches, visual changes, shortness of breath, chest pain, abdominal pain, severe nausea/vomiting, or problems with urination or bowel movements unless otherwise stated above. Pertinent History Reviewed:  Reviewed past medical,surgical, social, obstetrical and family history.  Reviewed problem list, medications and allergies. Physical Assessment:   Vitals:   07/10/20 1439  BP: 111/67  Pulse: 98  There is no height or weight on file to calculate BMI.  BP 111/67   Pulse 98   LMP 12/11/2019 (Exact Date)        Physical Examination:   General appearance: Well appearing, and in no distress  Mental status: Alert, oriented to person, place, and time  Skin: Warm & dry  Cardiovascular: Normal heart rate noted  Respiratory: Normal respiratory effort, no distress  Abdomen: Soft, gravid, nontender  Pelvic: Cervical exam deferred         Extremities: Edema: None  Fetal Status: Fetal Heart Rate (bpm): 141 Fundal Height: 34 cm Movement: Present    Results for orders placed or performed in visit on 07/10/20 (from the past 24 hour(s))  POC Urinalysis Dipstick OB   Collection Time: 07/10/20  2:46 PM  Result Value Ref Range   Color, UA     Clarity, UA     Glucose, UA Negative Negative   Bilirubin, UA     Ketones, UA neg    Spec Grav, UA      Blood, UA neg    pH, UA     POC,PROTEIN,UA Negative Negative, Trace, Small (1+), Moderate (2+), Large (3+), 4+   Urobilinogen, UA     Nitrite, UA neg    Leukocytes, UA Negative Negative   Appearance     Odor      Assessment & Plan:  1) Low-risk pregnancy G1P0 at [redacted]w[redacted]d with an Estimated Date of Delivery: 08/18/20   Plan:  Continue routine obstetrical care  Meds: No orders of the defined types were placed in this encounter.  Labs/procedures today: none  Reviewed: Preterm labor symptoms and general obstetric precautions including but not limited to vaginal bleeding, contractions, leaking of fluid and fetal movement were reviewed in detail with the patient.  All questions were answered  Follow-up: Return in about 2 weeks (around 07/24/2020) for GBS, GC/CHL, LROB.  By signing my name below, I, Pietro Cassis, attest that this documentation has been prepared under the direction and in the presence of Tilda Burrow, MD Electronically Signed: Pietro Cassis, Medical Scribe. 07/10/20. 3:00 PM.  I personally performed the services described in this documentation, which was SCRIBED in my presence. The recorded information has been reviewed and considered accurate. It has been edited as necessary during review. Tilda Burrow, MD

## 2020-07-24 ENCOUNTER — Encounter: Payer: Medicaid Other | Admitting: Obstetrics & Gynecology

## 2020-08-02 ENCOUNTER — Inpatient Hospital Stay (HOSPITAL_COMMUNITY)
Admission: AD | Admit: 2020-08-02 | Discharge: 2020-08-02 | Disposition: A | Discharge status: Home / Self Care | Payer: Medicaid Other | Attending: Obstetrics and Gynecology | Admitting: Obstetrics and Gynecology

## 2020-08-02 ENCOUNTER — Other Ambulatory Visit: Payer: Self-pay

## 2020-08-02 ENCOUNTER — Encounter (HOSPITAL_COMMUNITY): Payer: Self-pay | Admitting: Obstetrics and Gynecology

## 2020-08-02 DIAGNOSIS — O26893 Other specified pregnancy related conditions, third trimester: Secondary | ICD-10-CM | POA: Diagnosis not present

## 2020-08-02 DIAGNOSIS — Z3403 Encounter for supervision of normal first pregnancy, third trimester: Secondary | ICD-10-CM

## 2020-08-02 DIAGNOSIS — Z3A37 37 weeks gestation of pregnancy: Secondary | ICD-10-CM | POA: Insufficient documentation

## 2020-08-02 DIAGNOSIS — R609 Edema, unspecified: Secondary | ICD-10-CM | POA: Insufficient documentation

## 2020-08-02 DIAGNOSIS — Z87891 Personal history of nicotine dependence: Secondary | ICD-10-CM | POA: Diagnosis not present

## 2020-08-02 DIAGNOSIS — O368130 Decreased fetal movements, third trimester, not applicable or unspecified: Secondary | ICD-10-CM | POA: Insufficient documentation

## 2020-08-02 LAB — URINALYSIS, ROUTINE W REFLEX MICROSCOPIC
Bacteria, UA: NONE SEEN
Bilirubin Urine: NEGATIVE
Glucose, UA: NEGATIVE mg/dL
Hgb urine dipstick: NEGATIVE
Ketones, ur: NEGATIVE mg/dL
Nitrite: NEGATIVE
Protein, ur: NEGATIVE mg/dL
Specific Gravity, Urine: 1.02 (ref 1.005–1.030)
pH: 5 (ref 5.0–8.0)

## 2020-08-02 NOTE — MAU Provider Note (Addendum)
None     Chief Complaint:  Decreased Fetal Movement and Foot Swelling   Renee Crawford is  19 y.o. G1P0 at [redacted]w[redacted]d presents complaining of Decreased Fetal Movement and Foot Swelling .  She states she has had increased swelling in her hands and feet. Also, decreased FM today.  No HA/RUQ pain/blurry vision.   Obstetrical/Gynecological History: OB History    Gravida  1   Para      Term      Preterm      AB      Living        SAB      TAB      Ectopic      Multiple      Live Births             Past Medical History: Past Medical History:  Diagnosis Date  . Anemia   . Infection    UTI    Past Surgical History: Past Surgical History:  Procedure Laterality Date  . NO PAST SURGERIES      Family History: Family History  Problem Relation Age of Onset  . Asthma Mother   . Healthy Father     Social History: Social History   Tobacco Use  . Smoking status: Former Smoker    Types: Cigarettes    Quit date: 01/28/2020    Years since quitting: 0.5  . Smokeless tobacco: Never Used  Vaping Use  . Vaping Use: Former  . Quit date: 01/28/2020  Substance Use Topics  . Alcohol use: No  . Drug use: No    Allergies:  Allergies  Allergen Reactions  . Sulfa Antibiotics Anaphylaxis and Hives  . Amoxicillin-Pot Clavulanate Hives and Other (See Comments)  . Latex Rash    Meds:  Medications Prior to Admission  Medication Sig Dispense Refill Last Dose  . acetaminophen (TYLENOL) 500 MG tablet Take 500 mg by mouth every 6 (six) hours as needed.     . Blood Pressure Monitor MISC For regular home bp monitoring during pregnancy 1 each 0   . diphenhydrAMINE HCl (BENADRYL PO) Take by mouth as needed.     . Prenatal Vit-DSS-Fe Fum-FA (PRENATAL 19) tablet Take 1 tablet by mouth daily. (Patient not taking: Reported on 06/11/2020) 30 tablet 11     Review of Systems   Constitutional: Negative for fever and chills Eyes: Negative for visual disturbances Respiratory:  Negative for shortness of breath, dyspnea Cardiovascular: Negative for chest pain or palpitations  Gastrointestinal: Negative for vomiting, diarrhea and constipation Genitourinary: Negative for dysuria and urgency Musculoskeletal: Negative for back pain, joint pain, myalgias.  Normal ROM  Neurological: Negative for dizziness and headaches    Physical Exam  Blood pressure 122/85, pulse 96, temperature 98.5 F (36.9 C), temperature source Oral, resp. rate 16, last menstrual period 12/11/2019, SpO2 99 %. GENERAL: Well-developed, well-nourished female in no acute distress.  LUNGS: Normal respiratory effort HEART: Regular rate and rhythm. ABDOMEN: Soft, nontender, nondistended, gravid.  EXTREMITIES: Nontender, 1+ edema in hands and feet, 2+ distal pulses. DTR's 2+ CERVICAL EXAM: deferred FHT:  Baseline rate 145 bpm   Variability moderate  Accelerations present   Decelerations none. NST reactive, FHR Cat 1.  Lots of FM on EFM, not felt by pt.  Contractions: Every 0  mins   Labs: No results found for this or any previous visit (from the past 24 hour(s)). Imaging Studies:  No results found.  Assessment: Renee Crawford is  19 y.o. G1P0 at [redacted]w[redacted]d  presents with swelling, no evidence of GHTN/Preeclampsia Decreased FM w/very reactive NST, movement on EFM.  Plan: DC home Pick up BP cuff from Grandmother's house in [place] so she can keep an eye on her BPs  Jacklyn Shell 9/4/20211:32 PM

## 2020-08-02 NOTE — Discharge Instructions (Signed)
Renee Crawford, I greatly value your feedback.  If you receive a survey following your visit with [place] today, we appreciate you taking the time to fill it out.  Thanks, Cathie Beams, DNP, CNM  [facility] HAS MOVED!!! It is now [facility] at [facility] ([address] [place], [place] [address]) Entrance located off of [address] Free 24/7 valet parking   Go to [email or web address] to register for FREE online childbirth classes    Call the office [phone]) or go to [facility] if:  You begin to have strong, frequent contractions  Your water breaks.  Sometimes it is a big gush of fluid, sometimes it is just a trickle that keeps getting your panties wet or running down your legs  You have vaginal bleeding.  It is normal to have a small amount of spotting if your cervix was checked.   You don't feel your baby moving like normal.  If you don't, get you something to eat and drink and lay down and focus on feeling your baby move.  You should feel at least 10 movements in 2 hours.  If you don't, you should call the office or go to [facility].   Home Blood Pressure Monitoring for Patients   Your provider has recommended that you check your blood pressure (BP) at least once a week at home. If you do not have a blood pressure cuff at home, one will be provided for you. Contact your provider if you have not received your monitor within 1 week.   Helpful Tips for Accurate Home Blood Pressure Checks  . Don't smoke, exercise, or drink caffeine 30 minutes before checking your BP . Use the restroom before checking your BP (a full bladder can raise your pressure) . Relax in a comfortable upright chair . Feet on the ground . Left arm resting comfortably on a flat surface at the level of your heart . Legs uncrossed . Back supported . Sit quietly and don't talk . Place the cuff on your bare arm . Adjust snuggly, so that only two fingertips can fit  between your skin and the top of the cuff . Check 2 readings separated by at least one minute . Keep a log of your BP readings . For a visual, please reference this diagram: http://ccnc.care/bpdiagram  Provider Name: [facility]     Phone: [phone]  Zone 1: ALL CLEAR  Continue to monitor your symptoms:  . BP reading is less than 140 (top number) or less than 90 (bottom number)  . No right upper stomach pain . No headaches or seeing spots . No feeling nauseated or throwing up . No swelling in face and hands  Zone 2: CAUTION Call your doctor's office for any of the following:  . BP reading is greater than 140 (top number) or greater than 90 (bottom number)  . Stomach pain under your ribs in the middle or right side . Headaches or seeing spots . Feeling nauseated or throwing up . Swelling in face and hands  Zone 3: EMERGENCY  Seek immediate medical care if you have any of the following:  . BP reading is greater than160 (top number) or greater than 110 (bottom number) . Severe headaches not improving with Tylenol . Serious difficulty catching your breath . Any worsening symptoms from Zone 2

## 2020-08-02 NOTE — MAU Note (Signed)
Renee Crawford is a 19 y.o. at [redacted]w[redacted]d here in MAU reporting: increased swelling in hands and feet since yesterday. It is intermittent. No bleeding, LOF, or abnormal discharge. DFM.  Onset of complaint: ongoing but worse since yesterday  Pain score: 0/10  Vitals:   08/02/20 1242  BP: 122/85  Pulse: 96  Resp: 16  Temp: 98.5 F (36.9 C)  SpO2: 99%     FHT:134  Lab orders placed from triage: UA

## 2020-08-05 ENCOUNTER — Encounter: Payer: Self-pay | Admitting: Women's Health

## 2020-08-05 ENCOUNTER — Ambulatory Visit (INDEPENDENT_AMBULATORY_CARE_PROVIDER_SITE_OTHER): Payer: Medicaid Other | Admitting: Women's Health

## 2020-08-05 ENCOUNTER — Other Ambulatory Visit: Payer: Self-pay

## 2020-08-05 ENCOUNTER — Other Ambulatory Visit (HOSPITAL_COMMUNITY)
Admission: RE | Admit: 2020-08-05 | Discharge: 2020-08-05 | Disposition: A | Discharge status: Home / Self Care | Payer: Medicaid Other | Source: Ambulatory Visit | Attending: Obstetrics & Gynecology | Admitting: Obstetrics & Gynecology

## 2020-08-05 VITALS — BP 115/75 | HR 79 | Wt 229.2 lb

## 2020-08-05 DIAGNOSIS — Z1389 Encounter for screening for other disorder: Secondary | ICD-10-CM

## 2020-08-05 DIAGNOSIS — Z3403 Encounter for supervision of normal first pregnancy, third trimester: Secondary | ICD-10-CM

## 2020-08-05 DIAGNOSIS — Z331 Pregnant state, incidental: Secondary | ICD-10-CM

## 2020-08-05 DIAGNOSIS — Z3A38 38 weeks gestation of pregnancy: Secondary | ICD-10-CM

## 2020-08-05 LAB — POCT URINALYSIS DIPSTICK OB
Blood, UA: NEGATIVE
Glucose, UA: NEGATIVE
Ketones, UA: NEGATIVE
Leukocytes, UA: NEGATIVE
Nitrite, UA: NEGATIVE
POC,PROTEIN,UA: NEGATIVE

## 2020-08-05 NOTE — Progress Notes (Signed)
   LOW-RISK PREGNANCY VISIT Patient name: Renee Crawford MRN [id number]  Date of birth: Feb 26, 2001 Chief Complaint:   Routine Prenatal Visit (gbs-gc-chl)  History of Present Illness:   Renee Crawford is a 19 y.o. G1P0 female at [redacted]w[redacted]d with an Estimated Date of Delivery: 08/18/20 being seen today for ongoing management of a low-risk pregnancy.  Depression screen [facility] 2/9 06/11/2020 02/04/2020  Decreased Interest 3 0  Down, Depressed, Hopeless 0 0  PHQ - 2 Score 3 0  Altered sleeping 2 -  Tired, decreased energy 2 -  Change in appetite 0 -  Feeling bad or failure about yourself  0 -  Trouble concentrating 0 -  Moving slowly or fidgety/restless 0 -  Suicidal thoughts 0 -  PHQ-9 Score 7 -  Difficult doing work/chores Not difficult at all -    Today she reports getting high bp's at home 140s-150s/90-100s. No headache. Some seeing spots randomly. No ruq/epigastric pain, n/v.  Contractions: Not present.  .  Movement: Present. denies leaking of fluid. Review of Systems:   Pertinent items are noted in HPI Denies abnormal vaginal discharge w/ itching/odor/irritation, headaches, visual changes, shortness of breath, chest pain, abdominal pain, severe nausea/vomiting, or problems with urination or bowel movements unless otherwise stated above. Pertinent History Reviewed:  Reviewed past medical,surgical, social, obstetrical and family history.  Reviewed problem list, medications and allergies. Physical Assessment:   Vitals:   08/05/20 1112  BP: 115/75  Pulse: 79  Weight: 229 lb 3.2 oz (104 kg)  Body mass index is 39.34 kg/m.        Physical Examination:   General appearance: Well appearing, and in no distress  Mental status: Alert, oriented to person, place, and time  Skin: Warm & dry  Cardiovascular: Normal heart rate noted  Respiratory: Normal respiratory effort, no distress  Abdomen: Soft, gravid, nontender  Pelvic: Cervical exam performed  Dilation: Closed Effacement (%): Thick  Station: -2  Extremities: Edema: Mild pitting, slight indentation, DTRs 2+, no clonus  Fetal Status: Fetal Heart Rate (bpm): 142 Fundal Height: 38 cm Movement: Present Presentation: Vertex  Chaperone: Peggy Dones    No results found for this or any previous visit (from the past 24 hour(s)).  Assessment & Plan:  1) Low-risk pregnancy G1P0 at [redacted]w[redacted]d with an Estimated Date of Delivery: 08/18/20   2) Elevated home bp's, completely normal here, no proteinuria. Reviewed pre-e s/s, reasons to seek care. To bring cuff in for bp check against ours   Meds: No orders of the defined types were placed in this encounter.  Labs/procedures today: gbs, gc/ct, sve  Plan:  Continue routine obstetrical care  Next visit: prefers in person    Reviewed: Term labor symptoms and general obstetric precautions including but not limited to vaginal bleeding, contractions, leaking of fluid and fetal movement were reviewed in detail with the patient.  All questions were answered.   Follow-up: Return for one day this week for bp check w/ nurse, then 1wk for LROB in office w/ CNM.  Orders Placed This Encounter  Procedures  . Strep Gp B NAA+Rflx  . POC Urinalysis Dipstick OB   Cheral Marker CNM, [facility] 08/05/2020 11:41 AM

## 2020-08-05 NOTE — Patient Instructions (Addendum)
Renee Crawford, I greatly value your feedback.  If you receive a survey following your visit with [place] today, we appreciate you taking the time to fill it out.  Thanks, Renee Crawford, CNM, WHNP-BC  [facility] ([address] [place], [place] [address]) Entrance C, located off of [address] [employer] valet parking   Go to [email or web address] to register for FREE online childbirth classes    Call the office [phone]) or go to [facility] if:  You begin to have strong, frequent contractions  Your water breaks.  Sometimes it is a big gush of fluid, sometimes it is just a trickle that keeps getting your panties wet or running down your legs  You have vaginal bleeding.  It is normal to have a small amount of spotting if your cervix was checked.   You don't feel your baby moving like normal.  If you don't, get you something to eat and drink and lay down and focus on feeling your baby move.  You should feel at least 10 movements in 2 hours.  If you don't, you should call the office or go to [facility].   Call the office [phone]) or go to [facility] hospital for these signs of pre-eclampsia:  Severe headache that does not go away with Tylenol  Visual changes- seeing spots, double, blurred vision  Pain under your right breast or upper abdomen that does not go away with Tums or heartburn medicine  Nausea and/or vomiting  Severe swelling in your hands, feet, and face    Home Blood Pressure Monitoring for Patients   Your provider has recommended that you check your blood pressure (BP) at least once a week at home. If you do not have a blood pressure cuff at home, one will be provided for you. Contact your provider if you have not received your monitor within 1 week.   Helpful Tips for Accurate Home Blood Pressure Checks  . Don't smoke, exercise, or drink caffeine 30 minutes before checking your BP . Use the restroom before checking your BP (a full  bladder can raise your pressure) . Relax in a comfortable upright chair . Feet on the ground . Left arm resting comfortably on a flat surface at the level of your heart . Legs uncrossed . Back supported . Sit quietly and don't talk . Place the cuff on your bare arm . Adjust snuggly, so that only two fingertips can fit between your skin and the top of the cuff . Check 2 readings separated by at least one minute . Keep a log of your BP readings . For a visual, please reference this diagram: http://ccnc.care/bpdiagram  Provider Name: [facility]     Phone: [phone]  Zone 1: ALL CLEAR  Continue to monitor your symptoms:  . BP reading is less than 140 (top number) or less than 90 (bottom number)  . No right upper stomach pain . No headaches or seeing spots . No feeling nauseated or throwing up . No swelling in face and hands  Zone 2: CAUTION Call your doctor's office for any of the following:  . BP reading is greater than 140 (top number) or greater than 90 (bottom number)  . Stomach pain under your ribs in the middle or right side . Headaches or seeing spots . Feeling nauseated or throwing up . Swelling in face and hands  Zone 3: EMERGENCY  Seek immediate medical care if you have any of the  following:  . BP reading is greater than160 (top number) or greater than 110 (bottom number) . Severe headaches not improving with Tylenol . Serious difficulty catching your breath . Any worsening symptoms from Zone 2   Braxton Hicks Contractions Contractions of the uterus can occur throughout pregnancy, but they are not always a sign that you are in labor. You may have practice contractions called Braxton Hicks contractions. These false labor contractions are sometimes confused with true labor. What are Renee Crawford contractions? Braxton Hicks contractions are tightening movements that occur in the muscles of the uterus before labor. Unlike true labor contractions, these  contractions do not result in opening (dilation) and thinning of the cervix. Toward the end of pregnancy (32-34 weeks), Braxton Hicks contractions can happen more often and may become stronger. These contractions are sometimes difficult to tell apart from true labor because they can be very uncomfortable. You should not feel embarrassed if you go to the hospital with false labor. Sometimes, the only way to tell if you are in true labor is for your health care provider to look for changes in the cervix. The health care provider will do a physical exam and may monitor your contractions. If you are not in true labor, the exam should show that your cervix is not dilating and your water has not broken. If there are no other health problems associated with your pregnancy, it is completely safe for you to be sent home with false labor. You may continue to have Braxton Hicks contractions until you go into true labor. How to tell the difference between true labor and false labor True labor  Contractions last 30-70 seconds.  Contractions become very regular.  Discomfort is usually felt in the top of the uterus, and it spreads to the lower abdomen and low back.  Contractions do not go away with walking.  Contractions usually become more intense and increase in frequency.  The cervix dilates and gets thinner. False labor  Contractions are usually shorter and not as strong as true labor contractions.  Contractions are usually irregular.  Contractions are often felt in the front of the lower abdomen and in the groin.  Contractions may go away when you walk around or change positions while lying down.  Contractions get weaker and are shorter-lasting as time goes on.  The cervix usually does not dilate or become thin. Follow these instructions at home:  1. Take over-the-counter and prescription medicines only as told by your health care provider. 2. Keep up with your usual exercises and follow other  instructions from your health care provider. 3. Eat and drink lightly if you think you are going into labor. 4. If Braxton Hicks contractions are making you uncomfortable: ? Change your position from lying down or resting to walking, or change from walking to resting. ? Sit and rest in a tub of warm water. ? Drink enough fluid to keep your urine pale yellow. Dehydration may cause these contractions. ? Do slow and deep breathing several times an hour. 5. Keep all follow-up prenatal visits as told by your health care provider. This is important. Contact a health care provider if:  You have a fever.  You have continuous pain in your abdomen. Get help right away if:  Your contractions become stronger, more regular, and closer together.  You have fluid leaking or gushing from your vagina.  You pass blood-tinged mucus (bloody show).  You have bleeding from your vagina.  You have low back pain  that you never had before.  You feel your baby's head pushing down and causing pelvic pressure.  Your baby is not moving inside you as much as it used to. Summary  Contractions that occur before labor are called Braxton Hicks contractions, false labor, or practice contractions.  Braxton Hicks contractions are usually shorter, weaker, farther apart, and less regular than true labor contractions. True labor contractions usually become progressively stronger and regular, and they become more frequent.  Manage discomfort from [facility] contractions by changing position, resting in a warm bath, drinking plenty of water, or practicing deep breathing. This information is not intended to replace advice given to you by your health care provider. Make sure you discuss any questions you have with your health care provider. Document Revised: 10/28/2017 Document Reviewed: 03/31/2017 Elsevier Patient Education  2020 Elsevier Inc[employer]    AREA PEDIATRIC/FAMILY PRACTICE PHYSICIANS  [facility]  [place] [address] [place], [place] [address] Phone - [phone]   Fax - [phone]  [facility] AMOS [address] [place], [place]  [address] Phone - [phone]   Fax - [phone]  [facility] [address] [place], [place]  [address] Phone - [phone]   Fax - [phone]  [facility] [address] [place], [place]  [address] Phone - [phone]   Fax - [phone]  [facility] [address] [place], [place]  [address] Phone - [phone]   Fax - [phone]  [facility] [address] [place], [place]  [address] Phone - [phone]   Fax - [phone]  [facility] [place] [address] [place], [place]  [address] Phone - [phone]   Fax - [phone]  [facility] AT [facility] [address] [place], [address] [place], [place]  [address] Phone - [phone]   Fax - [phone]  [facility] AT [facility] [address] [place], [place]  [address] Phone - [phone]   Fax - [phone] [facility] AT [place] [address] [place], [place]  [address] Phone - [phone]   Fax - [phone]  [facility] [address] [place], [place]  [address] Phone - [phone]   Fax - [phone]  [facility] [address] [place], [place]  [address] Phone - [phone]   Fax - [phone]  [facility] [place] [address] [place], [place]  [address] Phone - [phone]   Fax - [phone]  [facility] [address] [place], [place]  [address] Phone - [phone]  [facility] [address] [place], [place]  [address] Phone - [phone]   Fax - [phone]  [facility] [address] [place], [place]  [address] Phone - [phone]   Fax  - [phone]  [facility] [address] [place], [place]  [address] Phone - [phone]   Fax - [phone]  [facility] [address] [place], [place]  [address] Phone - [phone]   Fax - [phone]   [facility] [address] [place] [address] [place], [place]  [address] Phone - [phone]   Fax - [phone]  [facility] [address] [place], [place]  [address] Phone - [phone]   Fax - [phone]  DAVID RUBIN [address] [place], [place]  [address] Phone - [phone]  Fax - [phone]  [facility] [address] [place], [place]  [address] Phone - [phone]   Fax - [phone]  [place] [facility] Alita Chyle [address] [place], [place]  [address] Phone - [phone]   Fax - [phone] Gerarda Fraction [address] [place], [place]  [address] Phone - [phone]   Fax - [phone]  [facility] [address] [place], [place]  [address] Phone - [phone]   Fax - [phone]  [facility] - [place] [address] [place], [place]  [address] Phone - [phone]   Fax - [phone]

## 2020-08-06 LAB — CERVICOVAGINAL ANCILLARY ONLY
Chlamydia: NEGATIVE
Comment: NEGATIVE
Comment: NORMAL
Neisseria Gonorrhea: NEGATIVE

## 2020-08-07 LAB — STREP GP B NAA+RFLX: Strep Gp B NAA+Rflx: NEGATIVE

## 2020-08-13 ENCOUNTER — Other Ambulatory Visit: Payer: Self-pay

## 2020-08-13 ENCOUNTER — Ambulatory Visit (INDEPENDENT_AMBULATORY_CARE_PROVIDER_SITE_OTHER): Payer: Medicaid Other | Admitting: Advanced Practice Midwife

## 2020-08-13 VITALS — BP 112/71 | HR 102 | Wt 232.0 lb

## 2020-08-13 DIAGNOSIS — Z3403 Encounter for supervision of normal first pregnancy, third trimester: Secondary | ICD-10-CM | POA: Diagnosis not present

## 2020-08-13 DIAGNOSIS — Z3A39 39 weeks gestation of pregnancy: Secondary | ICD-10-CM

## 2020-08-13 DIAGNOSIS — Z1389 Encounter for screening for other disorder: Secondary | ICD-10-CM

## 2020-08-13 DIAGNOSIS — Z331 Pregnant state, incidental: Secondary | ICD-10-CM

## 2020-08-13 LAB — POCT URINALYSIS DIPSTICK OB
Blood, UA: NEGATIVE
Glucose, UA: NEGATIVE
Leukocytes, UA: NEGATIVE
Nitrite, UA: NEGATIVE

## 2020-08-13 NOTE — Progress Notes (Signed)
   PRENATAL VISIT NOTE  Subjective:  Renee Crawford is a 19 y.o. G1P0 at [redacted]w[redacted]d being seen today for ongoing prenatal care.  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  Patient reports suprapubic pressure.  Contractions: Not present. Vag. Bleeding: None.  Movement: Present. Denies leaking of fluid.   The following portions of the patient's history were reviewed and updated as appropriate: allergies, current medications, past family history, past medical history, past social history, past surgical history and problem list. Problem list updated.  Objective:   Vitals:   08/13/20 1551  BP: 112/71  Pulse: (!) 102  Weight: 232 lb (105.2 kg)    Fetal Status: Fetal Heart Rate (bpm): 160 Fundal Height: 39 cm Movement: Present  Presentation: Vertex  General:  Alert, oriented and cooperative. Patient is in no acute distress.  Skin: Skin is warm and dry. No rash noted.   Cardiovascular: Normal heart rate noted  Respiratory: Normal respiratory effort, no problems with respiration noted  Abdomen: Soft, gravid, appropriate for gestational age.  Pain/Pressure: Absent     Pelvic: Cervical exam performed per patient request Dilation: Closed Effacement (%): Thick Station: -3  Extremities: Normal range of motion.  Edema: None  Mental Status: Normal mood and affect. Normal behavior. Normal judgment and thought content.   Assessment and Plan:  Pregnancy: G1P0 at [redacted]w[redacted]d  1. Encounter for supervision of normal first pregnancy in third trimester - LOB, routine care - Confirmed plan to deliver at [facility] - PDIOL scheduled for 41.0 weeks, Cytotec - POC Urinalysis Dipstick OB  Term labor symptoms and general obstetric precautions including but not limited to vaginal  bleeding, contractions, leaking of fluid and fetal movement were reviewed in detail with the patient. Please refer to After Visit Summary for other counseling recommendations.  No follow-ups on file.  Future Appointments  Date Time Provider Department Center  08/20/2020  4:10 PM Tilda Burrow, MD [facility]  08/25/2020  8:25 AM [facility] SCHED ROOM [facility] None    Calvert Cantor, CNM

## 2020-08-13 NOTE — Patient Instructions (Signed)

## 2020-08-18 ENCOUNTER — Telehealth (HOSPITAL_COMMUNITY): Payer: Self-pay | Admitting: *Deleted

## 2020-08-18 NOTE — Telephone Encounter (Signed)
Preadmission screen  

## 2020-08-19 ENCOUNTER — Other Ambulatory Visit: Payer: Self-pay | Admitting: Family Medicine

## 2020-08-20 ENCOUNTER — Ambulatory Visit (INDEPENDENT_AMBULATORY_CARE_PROVIDER_SITE_OTHER): Payer: Medicaid Other | Admitting: Obstetrics and Gynecology

## 2020-08-20 VITALS — Wt 233.0 lb

## 2020-08-20 DIAGNOSIS — Z3403 Encounter for supervision of normal first pregnancy, third trimester: Secondary | ICD-10-CM | POA: Diagnosis not present

## 2020-08-20 DIAGNOSIS — Z3A40 40 weeks gestation of pregnancy: Secondary | ICD-10-CM

## 2020-08-20 DIAGNOSIS — Z331 Pregnant state, incidental: Secondary | ICD-10-CM

## 2020-08-20 DIAGNOSIS — Z1389 Encounter for screening for other disorder: Secondary | ICD-10-CM | POA: Diagnosis not present

## 2020-08-20 LAB — POCT URINALYSIS DIPSTICK OB
Blood, UA: NEGATIVE
Glucose, UA: NEGATIVE
Ketones, UA: NEGATIVE
Leukocytes, UA: NEGATIVE
Nitrite, UA: NEGATIVE
POC,PROTEIN,UA: NEGATIVE

## 2020-08-20 NOTE — Progress Notes (Signed)
Patient ID: Renee Crawford, female   DOB: 2001-02-07, 19 y.o.   MRN: [id number]    LOW-RISK PREGNANCY VISIT Patient name: Renee Crawford MRN [id number]  Date of birth: 07/03/2001 Chief Complaint:   Routine Prenatal Visit  History of Present Illness:   Renee Crawford is a 19 y.o. G1P0 female at [redacted]w[redacted]d with an Estimated Date of Delivery: 08/18/20 being seen today for ongoing management of a low-risk pregnancy.  Depression screen [facility] 2/9 06/11/2020 02/04/2020  Decreased Interest 3 0  Down, Depressed, Hopeless 0 0  PHQ - 2 Score 3 0  Altered sleeping 2 -  Tired, decreased energy 2 -  Change in appetite 0 -  Feeling bad or failure about yourself  0 -  Trouble concentrating 0 -  Moving slowly or fidgety/restless 0 -  Suicidal thoughts 0 -  PHQ-9 Score 7 -  Difficult doing work/chores Not difficult at all -    Today she reports no complaints. Denies any changes in discharge. Contractions: Not present. Vag. Bleeding: None.  Movement: (!) Decreased. denies leaking of fluid. Review of Systems:   Pertinent items are noted in HPI Denies abnormal vaginal discharge w/ itching/odor/irritation, headaches, visual changes, shortness of breath, chest pain, abdominal pain, severe nausea/vomiting, or problems with urination or bowel movements unless otherwise stated above. Pertinent History Reviewed:  Reviewed past medical,surgical, social, obstetrical and family history.  Reviewed problem list, medications and allergies. Physical Assessment:   Vitals:   08/20/20 1603  Weight: 233 lb (105.7 kg)  Body mass index is 39.99 kg/m.        Physical Examination:   General appearance: Well appearing, and in no distress  Mental status: Alert, oriented to person, place, and time  Skin: Warm & dry  Cardiovascular: Normal heart rate noted  Respiratory: Normal respiratory effort, no distress  Abdomen: Soft, gravid, nontender  Pelvic: Cervical exam performed Posterior. Soft. Dilation: Fingertip    long -2  vertex  Extremities: Edema: Mild pitting, slight indentation  Fetal Status: Fetal Heart Rate (bpm): 130 NST Fundal Height: 36 cm Movement: (!) Decreased     NST: FHR 130 with accels to 170. No uterine activity. No decels.  Chaperone: Pietro Cassis    Results for orders placed or performed in visit on 08/20/20 (from the past 24 hour(s))  POC Urinalysis Dipstick OB   Collection Time: 08/20/20  4:02 PM  Result Value Ref Range   Color, UA     Clarity, UA     Glucose, UA Negative Negative   Bilirubin, UA     Ketones, UA n    Spec Grav, UA     Blood, UA n    pH, UA     POC,PROTEIN,UA Negative Negative, Trace, Small (1+), Moderate (2+), Large (3+), 4+   Urobilinogen, UA     Nitrite, UA n    Leukocytes, UA Negative Negative   Appearance     Odor      Assessment & Plan:  1) Low-risk pregnancy G1P0 at [redacted]w[redacted]d with an Estimated Date of Delivery: 08/18/20   2) Decreased fetal movement, NST today was reactive. Patient declined membrane sweep.   Meds: No orders of the defined types were placed in this encounter.  Labs/procedures today: none  Plan:  Scheduled for induction on 08/25/2020.  Reviewed: Term labor symptoms and general obstetric precautions including but not limited to vaginal bleeding, contractions, leaking of fluid and fetal movement were reviewed in detail with the patient.  All questions were answered. Check bp weekly, let  us know if >140/90.   Follow-up: No follow-ups on file.  Orders Placed This Encounter  Procedures  . POC Urinalysis Dipstick OB   08/20/2020 4:36 PM  By signing my name below, I, Pietro Cassis, attest that this documentation has been prepared under the direction and in the presence of Tilda Burrow, MD. Electronically Signed: Pietro Cassis, Medical Scribe. 08/20/20. 4:36 PM.  I personally performed the services described in this documentation, which was SCRIBED in my presence. The recorded information has been reviewed and considered accurate.  It has been edited as necessary during review. Tilda Burrow, MD

## 2020-08-21 ENCOUNTER — Telehealth (HOSPITAL_COMMUNITY): Payer: Self-pay | Admitting: *Deleted

## 2020-08-21 NOTE — Telephone Encounter (Signed)
Preadmission screen  

## 2020-08-25 ENCOUNTER — Other Ambulatory Visit: Payer: Self-pay

## 2020-08-25 ENCOUNTER — Inpatient Hospital Stay (HOSPITAL_COMMUNITY)
Admission: AD | Admit: 2020-08-25 | Discharge: 2020-08-28 | DRG: 806 | Disposition: A | Discharge status: Home / Self Care | Payer: Medicaid Other | Attending: Obstetrics and Gynecology | Admitting: Obstetrics and Gynecology

## 2020-08-25 ENCOUNTER — Inpatient Hospital Stay (HOSPITAL_COMMUNITY): Payer: Medicaid Other

## 2020-08-25 ENCOUNTER — Encounter (HOSPITAL_COMMUNITY): Payer: Self-pay | Admitting: Family Medicine

## 2020-08-25 ENCOUNTER — Inpatient Hospital Stay (HOSPITAL_COMMUNITY): Payer: Medicaid Other | Admitting: Anesthesiology

## 2020-08-25 DIAGNOSIS — O480 Post-term pregnancy: Principal | ICD-10-CM | POA: Diagnosis present

## 2020-08-25 DIAGNOSIS — Z3A41 41 weeks gestation of pregnancy: Secondary | ICD-10-CM | POA: Diagnosis not present

## 2020-08-25 DIAGNOSIS — O8612 Endometritis following delivery: Secondary | ICD-10-CM | POA: Diagnosis not present

## 2020-08-25 DIAGNOSIS — O1404 Mild to moderate pre-eclampsia, complicating childbirth: Secondary | ICD-10-CM | POA: Diagnosis present

## 2020-08-25 DIAGNOSIS — Z20822 Contact with and (suspected) exposure to covid-19: Secondary | ICD-10-CM | POA: Diagnosis present

## 2020-08-25 DIAGNOSIS — O1400 Mild to moderate pre-eclampsia, unspecified trimester: Secondary | ICD-10-CM

## 2020-08-25 DIAGNOSIS — Z3403 Encounter for supervision of normal first pregnancy, third trimester: Secondary | ICD-10-CM

## 2020-08-25 DIAGNOSIS — O99214 Obesity complicating childbirth: Secondary | ICD-10-CM | POA: Diagnosis present

## 2020-08-25 DIAGNOSIS — F410 Panic disorder [episodic paroxysmal anxiety] without agoraphobia: Secondary | ICD-10-CM | POA: Diagnosis present

## 2020-08-25 DIAGNOSIS — Z87891 Personal history of nicotine dependence: Secondary | ICD-10-CM | POA: Diagnosis not present

## 2020-08-25 LAB — CBC
HCT: 37.5 % (ref 36.0–46.0)
Hemoglobin: 12.1 g/dL (ref 12.0–15.0)
MCH: 28.3 pg (ref 26.0–34.0)
MCHC: 32.3 g/dL (ref 30.0–36.0)
MCV: 87.8 fL (ref 80.0–100.0)
Platelets: 287 10*3/uL (ref 150–400)
RBC: 4.27 MIL/uL (ref 3.87–5.11)
RDW: 14.1 % (ref 11.5–15.5)
WBC: 15.3 10*3/uL — ABNORMAL HIGH (ref 4.0–10.5)
nRBC: 0 % (ref 0.0–0.2)

## 2020-08-25 LAB — RESPIRATORY PANEL BY RT PCR (FLU A&B, COVID)
Influenza A by PCR: NEGATIVE
Influenza B by PCR: NEGATIVE
SARS Coronavirus 2 by RT PCR: NEGATIVE

## 2020-08-25 LAB — COMPREHENSIVE METABOLIC PANEL
ALT: 11 U/L (ref 0–44)
AST: 16 U/L (ref 15–41)
Albumin: 2.5 g/dL — ABNORMAL LOW (ref 3.5–5.0)
Alkaline Phosphatase: 181 U/L — ABNORMAL HIGH (ref 38–126)
Anion gap: 9 (ref 5–15)
BUN: 5 mg/dL — ABNORMAL LOW (ref 6–20)
CO2: 20 mmol/L — ABNORMAL LOW (ref 22–32)
Calcium: 8.9 mg/dL (ref 8.9–10.3)
Chloride: 108 mmol/L (ref 98–111)
Creatinine, Ser: 0.7 mg/dL (ref 0.44–1.00)
GFR calc Af Amer: >60 mL/min (ref >60)
GFR calc non Af Amer: >60 mL/min (ref >60)
Glucose, Bld: 84 mg/dL (ref 70–99)
Potassium: 3.9 mmol/L (ref 3.5–5.1)
Sodium: 137 mmol/L (ref 135–145)
Total Bilirubin: 0.5 mg/dL (ref 0.3–1.2)
Total Protein: 6.1 g/dL — ABNORMAL LOW (ref 6.5–8.1)

## 2020-08-25 LAB — CBC WITH DIFFERENTIAL/PLATELET
Abs Immature Granulocytes: 0.09 10*3/uL — ABNORMAL HIGH (ref 0.00–0.07)
Basophils Absolute: 0.1 10*3/uL (ref 0.0–0.1)
Basophils Relative: 0 %
Eosinophils Absolute: 0.2 10*3/uL (ref 0.0–0.5)
Eosinophils Relative: 1 %
HCT: 36.7 % (ref 36.0–46.0)
Hemoglobin: 12 g/dL (ref 12.0–15.0)
Immature Granulocytes: 1 %
Lymphocytes Relative: 15 %
Lymphs Abs: 2.4 10*3/uL (ref 0.7–4.0)
MCH: 28.9 pg (ref 26.0–34.0)
MCHC: 32.7 g/dL (ref 30.0–36.0)
MCV: 88.4 fL (ref 80.0–100.0)
Monocytes Absolute: 1 10*3/uL (ref 0.1–1.0)
Monocytes Relative: 7 %
Neutro Abs: 11.9 10*3/uL — ABNORMAL HIGH (ref 1.7–7.7)
Neutrophils Relative %: 76 %
Platelets: 275 10*3/uL (ref 150–400)
RBC: 4.15 MIL/uL (ref 3.87–5.11)
RDW: 14.1 % (ref 11.5–15.5)
WBC: 15.6 10*3/uL — ABNORMAL HIGH (ref 4.0–10.5)
nRBC: 0 % (ref 0.0–0.2)

## 2020-08-25 LAB — TYPE AND SCREEN
ABO/RH(D): A POS
Antibody Screen: NEGATIVE

## 2020-08-25 LAB — PROTEIN / CREATININE RATIO, URINE
Creatinine, Urine: 113.7 mg/dL
Protein Creatinine Ratio: 0.3 mg/mg{Cre} — ABNORMAL HIGH (ref 0.00–0.15)
Total Protein, Urine: 34 mg/dL

## 2020-08-25 MED ORDER — EPHEDRINE 5 MG/ML INJ: 10.0000 mg | INTRAVENOUS | Status: DC | PRN

## 2020-08-25 MED ORDER — LACTATED RINGERS IV SOLN: INTRAVENOUS | Status: DC

## 2020-08-25 MED ORDER — PHENYLEPHRINE 40 MCG/ML (10ML) SYRINGE FOR IV PUSH (FOR BLOOD PRESSURE SUPPORT): 80.0000 ug | PREFILLED_SYRINGE | INTRAVENOUS | Status: DC | PRN

## 2020-08-25 MED ORDER — ACETAMINOPHEN 325 MG PO TABS
650.0000 mg | ORAL_TABLET | ORAL | Status: DC | PRN
  Administered 2020-08-26: 650 mg via ORAL
  Filled 2020-08-25: qty 2

## 2020-08-25 MED ORDER — OXYTOCIN-SODIUM CHLORIDE 30-0.9 UT/500ML-% IV SOLN
2.5000 [IU]/h | INTRAVENOUS | Status: DC
  Filled 2020-08-25: qty 500

## 2020-08-25 MED ORDER — BUPIVACAINE HCL (PF) 0.75 % IJ SOLN
INTRAMUSCULAR | Status: DC | PRN
Start: 2020-08-25 — End: 2020-08-26
  Administered 2020-08-25: 12 mL/h via EPIDURAL

## 2020-08-25 MED ORDER — FENTANYL CITRATE (PF) 100 MCG/2ML IJ SOLN
100.0000 ug | INTRAMUSCULAR | Status: DC | PRN
  Administered 2020-08-25 (×3): 100 ug via INTRAVENOUS
  Filled 2020-08-25 (×3): qty 2

## 2020-08-25 MED ORDER — MISOPROSTOL 50MCG HALF TABLET
50.0000 ug | ORAL_TABLET | ORAL | Status: DC | PRN
  Administered 2020-08-25 (×2): 50 ug via BUCCAL
  Filled 2020-08-25 (×2): qty 1

## 2020-08-25 MED ORDER — LIDOCAINE HCL (PF) 1 % IJ SOLN
30.0000 mL | INTRAMUSCULAR | Status: AC | PRN
  Administered 2020-08-26: 30 mL via SUBCUTANEOUS
  Filled 2020-08-25: qty 30

## 2020-08-25 MED ORDER — OXYTOCIN-SODIUM CHLORIDE 30-0.9 UT/500ML-% IV SOLN
1.0000 m[IU]/min | INTRAVENOUS | Status: DC
  Administered 2020-08-25: 2 m[IU]/min via INTRAVENOUS

## 2020-08-25 MED ORDER — ONDANSETRON HCL 4 MG/2ML IJ SOLN: 4.0000 mg | Freq: Four times a day (QID) | INTRAMUSCULAR | Status: DC | PRN

## 2020-08-25 MED ORDER — SOD CITRATE-CITRIC ACID 500-334 MG/5ML PO SOLN: 30.0000 mL | ORAL | Status: DC | PRN

## 2020-08-25 MED ORDER — TERBUTALINE SULFATE 1 MG/ML IJ SOLN: 0.2500 mg | Freq: Once | INTRAMUSCULAR | Status: DC | PRN

## 2020-08-25 MED ORDER — LACTATED RINGERS IV SOLN: 500.0000 mL | Freq: Once | INTRAVENOUS | Status: DC

## 2020-08-25 MED ORDER — LIDOCAINE HCL (PF) 1 % IJ SOLN
INTRAMUSCULAR | Status: DC | PRN
  Administered 2020-08-25: 10 mL via EPIDURAL

## 2020-08-25 MED ORDER — DIPHENHYDRAMINE HCL 50 MG/ML IJ SOLN: 12.5000 mg | INTRAMUSCULAR | Status: DC | PRN

## 2020-08-25 MED ORDER — OXYTOCIN BOLUS FROM INFUSION
333.0000 mL | Freq: Once | INTRAVENOUS | Status: AC
  Administered 2020-08-26: 333 mL via INTRAVENOUS

## 2020-08-25 MED ORDER — LACTATED RINGERS IV SOLN
500.0000 mL | INTRAVENOUS | Status: DC | PRN
  Administered 2020-08-26: 500 mL via INTRAVENOUS

## 2020-08-25 MED ORDER — FENTANYL-BUPIVACAINE-NACL 0.5-0.125-0.9 MG/250ML-% EP SOLN
12.0000 mL/h | EPIDURAL | Status: DC | PRN
  Filled 2020-08-25: qty 250

## 2020-08-25 NOTE — Progress Notes (Signed)
LABOR PROGRESS NOTE  Renee Crawford is a 19 y.o. G1P0 at [redacted]w[redacted]d  admitted for IOL for PD   Subjective: Patient doing well, reports intermittent cramping and contractions   Objective: BP 120/70   Pulse 78   Temp 98.5 F (36.9 C) (Oral)   Resp 16   LMP 12/11/2019 (Exact Date)  or  Vitals:   08/25/20 1302 08/25/20 1419 08/25/20 1635 08/25/20 1746  BP: (!) 141/100 126/86 131/89 120/70  Pulse: (!) 113 90 88 78  Resp: 16   16  Temp:  98.2 F (36.8 C) 98.5 F (36.9 C)   TempSrc:  Oral Oral     FB out around 1740  Dilation: 3 Effacement (%): 50 Station: -3 Presentation: Vertex Exam by:: [employer] RN FHT: baseline rate 135, moderate varibility, +accel, no decel Toco: occasional mild UC   Labs: Lab Results  Component Value Date   WBC 15.3 (H) 08/25/2020   HGB 12.1 08/25/2020   HCT 37.5 08/25/2020   MCV 87.8 08/25/2020   PLT 287 08/25/2020    Patient Active Problem List   Diagnosis Date Noted  . Post term pregnancy 08/25/2020  . URI, acute 06/26/2020  . Nasal congestion 06/26/2020  . Frequent UTI 03/10/2020  . Supervision of normal first pregnancy 02/04/2020  . Paroxysmal tachycardia (HCC) 01/02/2018  . 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 G1P0 at [redacted]w[redacted]d here for IOL for PD   Labor: second dose of cytotec given at 1744, plan to start pitocin 2/2 at 2145.  Fetal Wellbeing:  Cat I  Pain Control:  IV Fentanyl  Anticipated MOD:  SVD  Sharyon Cable, CNM 08/25/2020, 6:53 PM

## 2020-08-25 NOTE — Anesthesia Preprocedure Evaluation (Signed)
Anesthesia Evaluation  Patient identified by MRN, date of birth, ID band Patient awake    Reviewed: Allergy & Precautions, H&P , NPO status , Patient's Chart, lab work & pertinent test results  History of Anesthesia Complications Negative for: history of anesthetic complications  Airway Mallampati: II  TM Distance: >3 FB Neck ROM: full    Dental no notable dental hx.    Pulmonary neg pulmonary ROS, former smoker,    Pulmonary exam normal        Cardiovascular negative cardio ROS Normal cardiovascular exam Rhythm:regular Rate:Normal     Neuro/Psych PSYCHIATRIC DISORDERS Anxiety negative neurological ROS     GI/Hepatic negative GI ROS, Neg liver ROS,   Endo/Other  Morbid obesity  Renal/GU      Musculoskeletal   Abdominal   Peds  Hematology negative hematology ROS (+)   Anesthesia Other Findings   Reproductive/Obstetrics (+) Pregnancy                             Anesthesia Physical Anesthesia Plan  ASA: III  Anesthesia Plan: Epidural   Post-op Pain Management:    Induction:   PONV Risk Score and Plan:   Airway Management Planned:   Additional Equipment:   Intra-op Plan:   Post-operative Plan:   Informed Consent: I have reviewed the patients History and Physical, chart, labs and discussed the procedure including the risks, benefits and alternatives for the proposed anesthesia with the patient or authorized representative who has indicated his/her understanding and acceptance.       Plan Discussed with:   Anesthesia Plan Comments:         Anesthesia Quick Evaluation

## 2020-08-25 NOTE — Anesthesia Procedure Notes (Signed)
Epidural Patient location during procedure: OB Start time: 08/25/2020 11:02 PM End time: 08/25/2020 11:16 PM  Staffing Anesthesiologist: Lucretia Kern, MD Performed: anesthesiologist   Preanesthetic Checklist Completed: patient identified, IV checked, risks and benefits discussed, monitors and equipment checked, pre-op evaluation and timeout performed  Epidural Patient position: sitting Prep: DuraPrep Patient monitoring: heart rate, continuous pulse ox and blood pressure Approach: midline Location: L3-L4 Injection technique: LOR air  Needle:  Needle type: Tuohy  Needle gauge: 17 G Needle length: 9 cm Needle insertion depth: 7 cm Catheter type: closed end flexible Catheter size: 19 Gauge Catheter at skin depth: 12 cm Test dose: negative  Assessment Events: blood not aspirated, injection not painful, no injection resistance, no paresthesia and negative IV test  Additional Notes Reason for block:procedure for pain

## 2020-08-25 NOTE — H&P (Signed)
LABOR AND DELIVERY ADMISSION HISTORY AND PHYSICAL NOTE  Renee Crawford is a 19 y.o. female G1P0 with IUP at [redacted]w[redacted]d by LMP consistent with 8wk [place] presenting for IOL for PD. She reports positive fetal movement. She denies leakage of fluid or vaginal bleeding. She denies cramping or contractions.   Prenatal History/Complications: PNC at [facility] Pregnancy complications:  - Hx of panic disorder  - Frequent UTI - Paroxysmal tachycardia   Past Medical History: Past Medical History:  Diagnosis Date  . Anemia   . Infection    UTI    Past Surgical History: Past Surgical History:  Procedure Laterality Date  . NO PAST SURGERIES      Obstetrical History: OB History    Gravida  1   Para      Term      Preterm      AB      Living        SAB      TAB      Ectopic      Multiple      Live Births              Social History: Social History   Socioeconomic History  . Marital status: Single    Spouse name: Not on file  . Number of children: 0  . Years of education: Not on file  . Highest education level: 10th grade  Occupational History  . Not on file  Tobacco Use  . Smoking status: Former Smoker    Types: Cigarettes    Quit date: 01/28/2020    Years since quitting: 0.5  . Smokeless tobacco: Never Used  Vaping Use  . Vaping Use: Every day  Substance and Sexual Activity  . Alcohol use: No  . Drug use: No  . Sexual activity: Yes    Birth control/protection: None  Other Topics Concern  . Not on file  Social History Narrative  . Not on file   Social Determinants of Health   Financial Resource Strain: Medium Risk  . Difficulty of Paying Living Expenses: Somewhat hard  Food Insecurity: Unknown  . Worried About Programme researcher, broadcasting/film/video in the Last Year: Patient refused  . Ran Out of Food in the Last Year: Patient refused  Transportation Needs: No Transportation Needs  . Lack of Transportation (Medical): No  . Lack of Transportation (Non-Medical): No  Physical  Activity: Sufficiently Active  . Days of Exercise per Week: 3 days  . Minutes of Exercise per Session: 100 min  Stress: Unknown  . Feeling of Stress : Patient refused  Social Connections: Socially Isolated  . Frequency of Communication with Friends and Family: Once a week  . Frequency of Social Gatherings with Friends and Family: Never  . Attends Religious Services: Never  . Active Member of Clubs or Organizations: No  . Attends Banker Meetings: Never  . Marital Status: Living with partner    Family History: Family History  Problem Relation Age of Onset  . Asthma Mother   . Healthy Father     Allergies: Allergies  Allergen Reactions  . Sulfa Antibiotics Anaphylaxis and Hives  . Amoxicillin-Pot Clavulanate Hives and Other (See Comments)  . Latex Rash    Medications Prior to Admission  Medication Sig Dispense Refill Last Dose  . acetaminophen (TYLENOL) 500 MG tablet Take 500 mg by mouth every 6 (six) hours as needed.      . Blood Pressure Monitor MISC For regular home bp monitoring during  pregnancy 1 each 0   . diphenhydrAMINE HCl (BENADRYL PO) Take by mouth as needed.      . Prenatal Vit-DSS-Fe Fum-FA (PRENATAL 19) tablet Take 1 tablet by mouth daily. 30 tablet 11      Review of Systems  All systems reviewed and negative except as stated in HPI  Physical Exam Blood pressure (!) 141/100, pulse (!) 113, resp. rate 16, last menstrual period 12/11/2019. General appearance: alert, cooperative and no distress Lungs: clear to auscultation bilaterally Heart: regular rate and rhythm Abdomen: soft, non-tender; bowel sounds normal Extremities: No calf swelling or tenderness Presentation: cephalic Fetal monitoring: 135/moderate/+accels/ no decelerations  Uterine activity: UI  Dilation: 1.5 Effacement (%): Thick Station: -3 Exam by:: Lanice Shirts CNM  Prenatal labs: ABO, Rh: A/Positive/-- (03/08 1616) Antibody: Negative (07/14 0845) Rubella: 5.33 (03/08  1616) RPR: Non Reactive (07/14 0845)  HBsAg: Negative (03/08 1616)  HIV: Non Reactive (07/14 0845)  GC/Chlamydia: negative  GBS: --Theda Sers (09/07 1522)  2 hr Glucola: 72-90-105 Genetic screening:  Negative  Anatomy US: normal female    FAMILY TREE  LAB RESULTS  Language English Pap <21  Initiated care at 12wk GC/CT Initial: -/-           36wks:  -/-  Dating by LMP c/w 8wk U/S    nSupport person  Genetics NT/IT:  neg     MaterniT21:normal girl    [address]/HgbE neg  Flu vaccine  CF neg  TDaP vaccine declined SMA neg  Rhogam n/a Fragile X neg       Anatomy US Normal girl 'Aubrey' Blood Type A/Positive/-- (03/08 1616)  Feeding Plan breast Antibody Negative (03/08 1616)  Contraception Pp Liletta HBsAg Negative (03/08 1616)  Circumcision n/a RPR Non Reactive (03/08 1616)  Pediatrician [place]- list given Rubella  5.33 (03/08 1616)  Prenatal Classes discussed HIV Non Reactive (03/08 1616)    Hep C neg    GTT/A1C Early:      26-28wks: normal  BTL Consent n/a GBS   neg     [ ]  PCN allergy  VBAC Consent n/a    Waterbirth [ ] Class [ ] Consent [ ] CNM visit PP Needs      Prenatal Transfer Tool  Maternal Diabetes: No Genetic Screening: Normal Maternal Ultrasounds/Referrals: Normal Fetal Ultrasounds or other Referrals:  None Maternal Substance Abuse:  No Significant Maternal Medications:  None Significant Maternal Lab Results: Group B Strep negative  No results found for this or any previous visit (from the past 24 hour(s)).  Patient Active Problem List   Diagnosis Date Noted  . Post term pregnancy 08/25/2020  . URI, acute 06/26/2020  . Nasal congestion 06/26/2020  . Frequent UTI 03/10/2020  . Supervision of normal first pregnancy 02/04/2020  . Paroxysmal tachycardia (HCC) 01/02/2018  . 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 G1P0 at [redacted]w[redacted]d here for IOL for PD   #Labor: IOL with cytotec and FB, FB placed  without complications at 1345 #Pain: Pain medication PRN  #FWB: Cat I  #ID:  GBS negative  #MOF: Breast  #MOC:Unsure  #HTN: One elevated BP of 141/100 upon arrival, patient denies HA, vision changes or RUQ pain, PEC labs ordered- continue to monitor BP closely   10/20/2014, CNM 08/25/2020, 1:50 PM

## 2020-08-26 ENCOUNTER — Encounter (HOSPITAL_COMMUNITY): Payer: Self-pay | Admitting: Family Medicine

## 2020-08-26 DIAGNOSIS — O1400 Mild to moderate pre-eclampsia, unspecified trimester: Secondary | ICD-10-CM

## 2020-08-26 DIAGNOSIS — Z3A41 41 weeks gestation of pregnancy: Secondary | ICD-10-CM

## 2020-08-26 DIAGNOSIS — O1404 Mild to moderate pre-eclampsia, complicating childbirth: Secondary | ICD-10-CM

## 2020-08-26 DIAGNOSIS — O480 Post-term pregnancy: Secondary | ICD-10-CM

## 2020-08-26 LAB — RPR: RPR Ser Ql: NONREACTIVE

## 2020-08-26 MED ORDER — DIBUCAINE (PERIANAL) 1 % EX OINT: 1.0000 "application " | TOPICAL_OINTMENT | CUTANEOUS | Status: DC | PRN

## 2020-08-26 MED ORDER — ACETAMINOPHEN 325 MG PO TABS
650.0000 mg | ORAL_TABLET | Freq: Four times a day (QID) | ORAL | Status: DC
  Administered 2020-08-26 – 2020-08-28 (×8): 650 mg via ORAL
  Filled 2020-08-26 (×8): qty 2

## 2020-08-26 MED ORDER — ONDANSETRON HCL 4 MG/2ML IJ SOLN: 4.0000 mg | INTRAMUSCULAR | Status: DC | PRN

## 2020-08-26 MED ORDER — GENTAMICIN SULFATE 40 MG/ML IJ SOLN
5.0000 mg/kg | INTRAVENOUS | Status: DC
  Administered 2020-08-26: 530 mg via INTRAVENOUS
  Filled 2020-08-26 (×2): qty 13.25

## 2020-08-26 MED ORDER — COCONUT OIL OIL
1.0000 "application " | TOPICAL_OIL | Status: DC | PRN
  Administered 2020-08-27: 1 via TOPICAL

## 2020-08-26 MED ORDER — WITCH HAZEL-GLYCERIN EX PADS: 1.0000 "application " | MEDICATED_PAD | CUTANEOUS | Status: DC | PRN

## 2020-08-26 MED ORDER — BENZOCAINE-MENTHOL 20-0.5 % EX AERO: 1.0000 "application " | INHALATION_SPRAY | CUTANEOUS | Status: DC | PRN

## 2020-08-26 MED ORDER — SIMETHICONE 80 MG PO CHEW: 80.0000 mg | CHEWABLE_TABLET | ORAL | Status: DC | PRN

## 2020-08-26 MED ORDER — CLINDAMYCIN PHOSPHATE 900 MG/50ML IV SOLN
900.0000 mg | Freq: Three times a day (TID) | INTRAVENOUS | Status: DC
  Administered 2020-08-26 – 2020-08-27 (×4): 900 mg via INTRAVENOUS
  Filled 2020-08-26 (×5): qty 50

## 2020-08-26 MED ORDER — SENNOSIDES-DOCUSATE SODIUM 8.6-50 MG PO TABS
2.0000 | ORAL_TABLET | ORAL | Status: DC
  Administered 2020-08-26 – 2020-08-27 (×2): 2 via ORAL
  Filled 2020-08-26 (×2): qty 2

## 2020-08-26 MED ORDER — IBUPROFEN 600 MG PO TABS
600.0000 mg | ORAL_TABLET | Freq: Four times a day (QID) | ORAL | Status: DC
  Administered 2020-08-26 – 2020-08-28 (×7): 600 mg via ORAL
  Filled 2020-08-26 (×7): qty 1

## 2020-08-26 MED ORDER — ZOLPIDEM TARTRATE 5 MG PO TABS: 5.0000 mg | ORAL_TABLET | Freq: Every evening | ORAL | Status: DC | PRN

## 2020-08-26 MED ORDER — DIPHENHYDRAMINE HCL 25 MG PO CAPS: 25.0000 mg | ORAL_CAPSULE | Freq: Four times a day (QID) | ORAL | Status: DC | PRN

## 2020-08-26 MED ORDER — TETANUS-DIPHTH-ACELL PERTUSSIS 5-2.5-18.5 LF-MCG/0.5 IM SUSP: 0.5000 mL | Freq: Once | INTRAMUSCULAR | Status: DC

## 2020-08-26 MED ORDER — PRENATAL MULTIVITAMIN CH
1.0000 | ORAL_TABLET | Freq: Every day | ORAL | Status: DC
  Administered 2020-08-26 – 2020-08-28 (×3): 1 via ORAL
  Filled 2020-08-26 (×3): qty 1

## 2020-08-26 MED ORDER — ONDANSETRON HCL 4 MG PO TABS: 4.0000 mg | ORAL_TABLET | ORAL | Status: DC | PRN

## 2020-08-26 MED ORDER — IBUPROFEN 600 MG PO TABS
600.0000 mg | ORAL_TABLET | Freq: Three times a day (TID) | ORAL | Status: DC
  Administered 2020-08-26 (×2): 600 mg via ORAL
  Filled 2020-08-26: qty 1

## 2020-08-26 NOTE — Progress Notes (Signed)
Renee Crawford is a 19 y.o. G1P0 at [redacted]w[redacted]d by LMP admitted for induction of labor due to Post dates. Due date 08/18/2020.  Subjective: Called to room for late decels in FHR tracing. Request for AROM and insertion of IUPC.  Objective: BP 118/72   Pulse 67   Temp 99.2 F (37.3 C) (Oral)   Resp 18   Ht 5\' 3"  (1.6 m)   Wt 105.7 kg   LMP 12/11/2019 (Exact Date)   SpO2 99%   BMI 41.28 kg/m  No intake/output data recorded. Total I/O In: -  Out: 400 [Urine:400]  FHT:  FHR: 140 bpm, variability: moderate,  accelerations:  Present,  decelerations:  Present variables (unable to compare with UC's) UC:   undeterminable SVE:   Dilation: 6 Effacement (%): 90 Station: 0 Exam by:: [email or web address], CNM No BOW felt with exam  small amount of blood tinged fluid after fundal pressure was applied by RN -- IUPC inserted without difficulty; patient tolerated procedure well On Pitocin 6 milliunits/min  Labs: Lab Results  Component Value Date   WBC 15.6 (H) 08/25/2020   HGB 12.0 08/25/2020   HCT 36.7 08/25/2020   MCV 88.4 08/25/2020   PLT 275 08/25/2020    Assessment / Plan: Induction of labor due to postterm,  progressing well on pitocin  Labor: Progressing on Pitocin, will continue to increase then AROM Preeclampsia:  n/a Fetal Wellbeing:  Category II Pain Control:  Epidural I/D:  n/a Anticipated MOD: Cautious for NSVD  Dr. 08/27/2020 made of aware and tracing reviewed by him  Donavan Foil, MSN 08/26/2020, 2:57 AM

## 2020-08-26 NOTE — Discharge Summary (Signed)
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 yo G1P1001 at 491w1das admitted to the hospital 08/25/2020 for induction of labor.  Indication for induction: Postdates. Pt met criteria for preeclampsia without severe features on admission based on UP:C 0.30. Her blood pressures remained normal to mild range throughout hospitalization. Patient had an uncomplicated labor course as follows: Membrane Rupture Time/Date: 2:50 AM ,08/26/2020   Delivery Method:Vaginal, Spontaneous  Episiotomy: None  Lacerations:  2nd degree;Perineal;Labial  Details of delivery can be found in separate delivery note. Postpartum course complicated by maternal fever to 100.25F shortly after delivery; pt was empirically treated for postpartum endometritis with gentamycin and clindamycin. Patient is discharged home 08/28/20.  Newborn Data: Birth date:08/26/2020  Birth time:6:01 AM  Gender:Female  Living status:Living  Apgars:8 ,9  Weight:3609 g   Magnesium Sulfate received: No BMZ received: No Rhophylac:N/A MMR:N/A T-DaP: pt declined in prenatal period & in postpartum period Flu: No Transfusion:No  Physical exam  Vitals:   08/27/20 0622  08/27/20 1433 08/27/20 2225 08/28/20 0558  BP: 113/78 113/85 120/89 131/88  Pulse: 69 76 99 69  Resp: '18 17 20 18  ' Temp: 98.4 F (36.9 C) 98.2 F (36.8 C) 98.9 F (37.2 C) 98.1 F (36.7 C)  TempSrc: Oral Oral Oral Oral  SpO2: 99% 99%  100%  Weight:      Height:       General: alert, cooperative and no distress Lochia: appropriate Uterine Fundus: firm Incision: N/A DVT Evaluation: No evidence of DVT seen on physical exam. No cords or calf tenderness. No significant calf/ankle edema. Labs: Lab Results  Component Value Date   WBC 15.6 (H) 08/25/2020   HGB 12.0 08/25/2020   HCT 36.7 08/25/2020   MCV 88.4 08/25/2020   PLT 275 08/25/2020   CMP Latest Ref Rng & Units 08/25/2020  Glucose 70 - 99 mg/dL 84  BUN 6 - 20 mg/dL 5(L)  Creatinine 0.44 - 1.00 mg/dL 0.70  Sodium 135 - 145 mmol/L 137  Potassium 3.5 - 5.1 mmol/L 3.9  Chloride 98 - 111 mmol/L 108  CO2 22 - 32 mmol/L 20(L)  Calcium 8.9 - 10.3 mg/dL 8.9  Total Protein 6.5 - 8.1 g/dL 6.1(L)  Total Bilirubin 0.3 - 1.2 mg/dL 0.5  Alkaline Phos 38 - 126 U/L 181(H)  AST 15 - 41 U/L 16  ALT 0 - 44 U/L 11   Edinburgh Score: Edinburgh Postnatal Depression Scale Screening Tool 08/27/2020  I have been able to laugh and see the funny side of things. 0  I have looked forward with enjoyment to things. 0  I have  blamed myself unnecessarily when things went wrong. 0  I have been anxious or worried for no good reason. 0  I have felt scared or panicky for no good reason. 0  Things have been getting on top of me. 0  I have been so unhappy that I have had difficulty sleeping. 0  I have felt sad or miserable. 0  I have been so unhappy that I have been crying. 0  The thought of harming myself has occurred to me. 0  Edinburgh Postnatal Depression Scale Total 0     After visit meds:  Allergies as of 08/28/2020      Reactions   Sulfa Antibiotics Anaphylaxis, Hives   Amoxicillin-pot Clavulanate Hives, Other (See Comments)   Latex Rash       Medication List    STOP taking these medications   Blood Pressure Monitor Misc     TAKE these medications   acetaminophen 325 MG tablet Commonly known as: Tylenol Take 2 tablets (650 mg total) by mouth every 6 (six) hours as needed for mild pain or moderate pain.   coconut oil Oil Apply 1 application topically as needed (nipple pain).   ibuprofen 600 MG tablet Commonly known as: ADVIL Take 1 tablet (600 mg total) by mouth every 8 (eight) hours as needed for moderate pain or cramping.   Prenatal 19 tablet Take 1 tablet by mouth daily.        Discharge home in stable condition Infant Feeding: Breast Infant Disposition:home with mother Discharge instruction: per After Visit Summary and Postpartum booklet. Activity: Advance as tolerated. Pelvic rest for 6 weeks.  Diet: routine diet Future Appointments: Future Appointments  Date Time Provider [place]  10/01/2020  1:50 PM Myrtis Ser, CNM [facility]   Follow up Visit: message sent to [facility] to schedule PP appt  Please schedule this patient for a In person postpartum visit in 4 weeks with the following provider: Any provider. Additional Postpartum F/U:BP check 2-3 days  Low risk pregnancy complicated by: preeclampsia w/o severe features (diagnosed on admission based on UP:C 0.30) Delivery mode:  Vaginal, Spontaneous  Anticipated Birth Control:  Unsure (considering outpatient IUD)   08/28/2020 Randa Ngo, MD

## 2020-08-26 NOTE — Progress Notes (Signed)
[name] was referred for history of panic disorder/panic attacks.  * Referral screened out by Clinical Social Worker because none of the following criteria appear to apply: ~ History of anxiety/depression during this pregnancy, or of post-partum depression following prior delivery. ~ Diagnosis of panic disorder/panic attacks within last 3 years. Per further chart review, [name] diagnosed with panic disorder/panic attacks in 2015 with it also being reported in PNC records that it is not "worriesome".  OR * [name] symptoms currently being treated with medication and/or therapy.   Please contact the Clinical Social Worker if needs arise, by [name] request, or if [name] scores greater than 9/yes to question 10 on Edinburgh Postpartum Depression Screen.   Renee Crawford S. Renee Crawford, MSW, LCSW [facility] [place] [phone]    

## 2020-08-26 NOTE — Progress Notes (Signed)
Renee Crawford is a 19 y.o. G1P0 at [redacted]w[redacted]d by LMP admitted for induction of labor due to Post dates. Due date 07/2020.  Subjective: S/P IUPC with category 2 FHR tracing.  Objective: BP 118/72   Pulse 67   Temp 99.2 F (37.3 C) (Oral)   Resp 18   Ht 5\' 3"  (1.6 m)   Wt 105.7 kg   LMP 12/11/2019 (Exact Date)   SpO2 99%   BMI 41.28 kg/m  No intake/output data recorded. Total I/O In: -  Out: 400 [Urine:400]  FHT:  FHR: 140 bpm, variability: moderate,  accelerations:  Abscent,  decelerations:  Present lates with every contraction UC:   regular, every 1.5-2 minutes SVE:   Dilation: 6 Effacement (%): 90 Station: 0 Exam by:: [email or web address], CNM  Labs: Lab Results  Component Value Date   WBC 15.6 (H) 08/25/2020   HGB 12.0 08/25/2020   HCT 36.7 08/25/2020   MCV 88.4 08/25/2020   PLT 275 08/25/2020    Assessment / Plan: Induction of labor due to postterm,  progressing well on pitocin Tachystole Late Decelerations  Labor: Progressing on Pitocin, will continue to titrate using MVUs  Decrease Pitocin down to 3 milliunits/min Preeclampsia:  n/a Fetal Wellbeing:  Category II Pain Control:  Epidural I/D:  n/a Anticipated MOD: Cautious for NSVD  Dr. 08/27/2020 notified and reviewed FHR tracing  Donavan Foil, CNM 08/26/2020, 3:35 AM

## 2020-08-27 NOTE — Progress Notes (Signed)
Pt c/o of IV site burning. IV site WNL. MD on called notified. Will hold antibiotics for now. MD to reassess pt this am.

## 2020-08-27 NOTE — Progress Notes (Addendum)
POSTPARTUM PROGRESS NOTE  Post Partum Day 1  Subjective:  Renee Crawford is a 19 y.o. G1P1001 s/p VD at [redacted]w[redacted]d.  She reports she is doing well. No acute events overnight. She denies any problems with ambulating, voiding or po intake. Denies nausea or vomiting. Pain is well controlled. Lochia is mild.  Objective: Blood pressure 113/78, pulse 69, temperature 98.4 F (36.9 C), temperature source Oral, resp. rate 18, height 5\' 3"  (1.6 m), weight 105.7 kg, last menstrual period 12/11/2019, SpO2 99 %, unknown if currently breastfeeding.  Physical Exam:  General: alert, cooperative and no distress Chest: no respiratory distress Heart: distal pulses intact Abdomen: soft, nontender Uterine Fundus: firm, appropriately tender Extremities: No edema Skin: warm, dry  Recent Labs    08/25/20 1310 08/25/20 2211  HGB 12.1 12.0  HCT 37.5 36.7    Assessment/Plan: Renee Crawford is a 19 y.o. G1P1001 s/p VD at [redacted]w[redacted]d.  PPD#1 - Doing well  Routine postpartum care  PP endometritis: Improved.  Afebrile since 9/28 0700. Will d/c gent and clindamycin.  Contraception: Undecided, would like to wait until pp visit, aware of options  Feeding: formula feeding  Dispo: Plan for discharge tomorrow.   LOS: 2 days   10/28, DO Family Medicine PGY-3 08/27/2020, 7:57 AM  Attestation of Supervision of Student:  I confirm that I have verified the information documented in the  resident's  note and that I have also personally  re-examined  the history, physical exam and all medical decision making activities.  I have verified that all services and findings are accurately documented in this student's note; and I agree with management and plan as outlined in the documentation. I have also made any necessary editorial changes.  08/29/2020, MD [facility], [facility] 08/27/2020 9:11 PM

## 2020-08-27 NOTE — Anesthesia Postprocedure Evaluation (Signed)
Anesthesia Post Note  Patient: Renee Crawford  Procedure(s) Performed: AN AD HOC LABOR EPIDURAL     Patient location during evaluation: Mother Baby Anesthesia Type: Epidural Level of consciousness: awake and alert, oriented and patient cooperative Pain management: pain level controlled Vital Signs Assessment: post-procedure vital signs reviewed and stable Respiratory status: spontaneous breathing Cardiovascular status: stable Postop Assessment: no headache, epidural receding, patient able to bend at knees and no signs of nausea or vomiting Anesthetic complications: no Comments: Pt. States she is walking. Pain score 2.    No complications documented.  Last Vitals:  Vitals:   08/26/20 2249 08/27/20 0622  BP: 116/78 113/78  Pulse: 88 69  Resp: 18 18  Temp: 37 C 36.9 C  SpO2: 99% 99%    Last Pain:  Vitals:   08/27/20 0740  TempSrc:   PainSc: 0-No pain   Pain Goal:                   [facility]

## 2020-08-28 MED ORDER — ACETAMINOPHEN 325 MG PO TABS: 650.0000 mg | ORAL_TABLET | Freq: Four times a day (QID) | ORAL | Status: DC | PRN

## 2020-08-28 MED ORDER — COCONUT OIL OIL: 1.0000 "application " | TOPICAL_OIL | 0 refills | Status: DC | PRN

## 2020-08-28 MED ORDER — IBUPROFEN 600 MG PO TABS: 600.0000 mg | ORAL_TABLET | Freq: Three times a day (TID) | ORAL | 0 refills | Status: DC | PRN

## 2020-08-28 NOTE — Discharge Instructions (Signed)

## 2020-09-04 ENCOUNTER — Other Ambulatory Visit: Payer: Self-pay | Admitting: Advanced Practice Midwife

## 2020-09-04 ENCOUNTER — Telehealth: Payer: Self-pay | Admitting: Advanced Practice Midwife

## 2020-09-04 MED ORDER — NORETHINDRONE 0.35 MG PO TABS: 1.0000 | ORAL_TABLET | Freq: Every day | ORAL | 11 refills | Status: DC

## 2020-09-04 NOTE — Telephone Encounter (Signed)
Pt called and wants to go ahead and get on [facility] now and not wait til Post Pard/ she would like pills called into [facility] on [address]

## 2020-09-04 NOTE — Progress Notes (Signed)
micronoir called to [facility]. Start now.

## 2020-10-01 ENCOUNTER — Ambulatory Visit: Payer: Medicaid Other | Admitting: Advanced Practice Midwife

## 2020-10-01 ENCOUNTER — Ambulatory Visit (INDEPENDENT_AMBULATORY_CARE_PROVIDER_SITE_OTHER): Payer: Medicaid Other | Admitting: Advanced Practice Midwife

## 2020-10-01 DIAGNOSIS — Z3009 Encounter for other general counseling and advice on contraception: Secondary | ICD-10-CM | POA: Diagnosis not present

## 2020-10-01 DIAGNOSIS — Z8759 Personal history of other complications of pregnancy, childbirth and the puerperium: Secondary | ICD-10-CM

## 2020-10-01 NOTE — Progress Notes (Signed)
POSTPARTUM VISIT Patient name: Renee Crawford MRN [id number]  Date of birth: July 08, 2001 Chief Complaint:   Postpartum Care  History of Present Illness:   Renee Crawford is a 19 y.o. G40P1001 Caucasian female being seen today for a postpartum visit. She is 4 weeks postpartum following a spontaneous vaginal delivery at 41.1 gestational weeks. IOL: Yes, for Postdates. Anesthesia: local and epidural.  Laceration: 2nd deg perineal and R labial w/o repair.  Complications: dx of pre-e without severe features made on admission (due to mild range BPs and P/C ratio 0.3). Inpatient contraception: no.   Pregnancy uncomplicated. Tobacco use: no. Substance use disorder: no. Last pap smear: <21yo  No LMP recorded.  Postpartum course has been uncomplicated. Bleeding had stopped, then x 3d ago it began with some 'black' discharge and was pretty heavy (using a pad/tampon). Bowel function is normal. Bladder function is normal. Urinary incontinence? No, fecal incontinence? No Patient is sexually active. Last sexual activity: x1wk ago.  Desired contraception: long conversation re options; has already had unprotected IC multiple times since delivery although she isn't desirous of preg. Patient does not know want a pregnancy in the future.  Desired family size is unsure children.   The pregnancy intention screening data noted above was reviewed. Potential methods of contraception were discussed. The patient elected to proceed with Unknown/Not Reported.   Edinburgh Postpartum Depression Screening: negative  Edinburgh Postnatal Depression Scale - 10/01/20 1601      Edinburgh Postnatal Depression Scale:  In the Past 7 Days   I have been able to laugh and see the funny side of things. 0    I have looked forward with enjoyment to things. 0    I have blamed myself unnecessarily when things went wrong. 0    I have been anxious or worried for no good reason. 0    I have felt scared or panicky for no good reason. 0      Things have been getting on top of me. 0    I have been so unhappy that I have had difficulty sleeping. 0    I have felt sad or miserable. 0    I have been so unhappy that I have been crying. 0    The thought of harming myself has occurred to me. 0    Edinburgh Postnatal Depression Scale Total 0          Baby's course has been uncomplicated. Baby is feeding by bottle. Infant has a pediatrician/family doctor? Yes.  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  Pt has material needs met for her and baby: Yes.   Review of Systems:   Pertinent items are noted in HPI Denies Abnormal vaginal discharge w/ itching/odor/irritation, headaches, visual changes, shortness of breath, chest pain, abdominal pain, severe nausea/vomiting, or problems with urination or bowel movements. Pertinent History Reviewed:  Reviewed past medical,surgical, obstetrical and family history.  Reviewed problem list, medications and allergies. OB History  Gravida Para Term Preterm AB Living  '1 1 1     1  ' SAB TAB Ectopic Multiple Live Births        0 1    # Outcome Date GA Lbr Len/2nd Weight Sex Delivery Anes PTL Lv  1 Term 08/26/20 [redacted]w[redacted]d/ 00:30 7 lb 15.3 oz (3.609 kg) F Vag-Spont EPI  LIV   Physical Assessment:   Vitals:   10/01/20 1600  BP: 110/70  Pulse: 75  Weight: 209  lb 9.6 oz (95.1 kg)  Body mass index is 37.13 kg/m.       Physical Examination:   General appearance: alert, well appearing, and in no distress  Mental status: alert, oriented to person, place, and time  Skin: warm & dry   Cardiovascular: normal heart rate noted   Respiratory: normal respiratory effort, no distress   Breasts: deferred, no complaints   Abdomen: soft, non-tender   Pelvic: normal external genitalia, vulva, vagina, cervix, uterus and adnexa; gentle SE>sm blood seen at cervix  Rectal: no hemorrhoids  Extremities: no edema         No results found for  this or any previous visit (from the past 24 hour(s)).  Assessment & Plan:  1) Postpartum exam 2) 4 wks s/p spontaneous vaginal delivery 3) bottle feeding 4) Depression screening 5) Contraception counseling>lots of time spent strongly encouraging starting on contraception; literature and www.bedsider.org site recommended; condoms recommended in the meantime; may call and get started on OCPs/patch but for Nex or IUD will need to be on cycle or abstinent x 2wks 6) Heavier cycle> no s/s of delayed endometritis, rev'd hormonal adjustment probably  Essential components of care per ACOG recommendations:  1.  Mood and well being:  . If positive depression screen, discussed and plan developed.  . If using tobacco we discussed reduction/cessation and risk of relapse . If current substance abuse, we discussed and referral to local resources was offered.   2. Infant care and feeding:  . If breastfeeding, discussed returning to work, pumping, breastfeeding-associated pain, guidance regarding return to fertility while lactating if not using another method. If needed, patient was provided with a letter to be allowed to pump q 2-3hrs to support lactation in a private location with access to a refrigerator to store breastmilk.   . Recommended that all caregivers be immunized for flu, pertussis and other preventable communicable diseases . If pt does not have material needs met for her/baby, referred to local resources for help obtaining these.  3. Sexuality, contraception and birth spacing . Provided guidance regarding sexuality, management of dyspareunia, and resumption of intercourse . Discussed avoiding interpregnancy interval <81mhs and recommended birth spacing of 18 months  4. Sleep and fatigue . Discussed coping options for fatigue and sleep disruption . Encouraged family/partner/community support of 4 hrs of uninterrupted sleep to help with mood and fatigue  5. Physical recovery  . If pt had a  C/S, assessed incisional pain and providing guidance on normal vs prolonged recovery . If pt had a laceration, perineal healing and pain reviewed.  . If urinary or fecal incontinence, discussed management and referred to PT or uro/gyn if indicated  . Patient is safe to resume physical activity. Discussed attainment of healthy weight.  6.  Chronic disease management . Discussed pregnancy complications if any, and their implications for future childbearing and long-term maternal health. . Review recommendations for prevention of recurrent pregnancy complications, such as 17 hydroxyprogesterone caproate to reduce risk for recurrent PTB not applicable, or aspirin to reduce risk of preeclampsia yes. . Pt had GDM: No. If yes, 2hr GTT scheduled: not applicable. . Reviewed medications and non-pregnant dosing including consideration of whether pt is breastfeeding using a reliable resource such as LactMed: not applicable . Referred for f/u w/ PCP or subspecialist providers as indicated: not applicable  7. Health maintenance . Mammogram at [id number] earlier if indicated . Pap smears as indicated  Meds: No orders of the defined types were placed in this encounter.  Follow-up: Return in about 1 year (around 10/01/2021) for Physical.   Orders Placed This Encounter  Procedures  . POCT urine pregnancy    Myrtis Ser [facility] 10/01/2020 4:45 PM

## 2020-10-21 ENCOUNTER — Other Ambulatory Visit: Payer: Self-pay

## 2020-10-21 ENCOUNTER — Ambulatory Visit (INDEPENDENT_AMBULATORY_CARE_PROVIDER_SITE_OTHER): Payer: Medicaid Other

## 2020-10-21 ENCOUNTER — Encounter (HOSPITAL_COMMUNITY): Payer: Self-pay | Admitting: Emergency Medicine

## 2020-10-21 ENCOUNTER — Ambulatory Visit (HOSPITAL_COMMUNITY)
Admission: EM | Admit: 2020-10-21 | Discharge: 2020-10-21 | Disposition: A | Discharge status: Home / Self Care | Payer: Medicaid Other | Attending: Family Medicine | Admitting: Family Medicine

## 2020-10-21 DIAGNOSIS — R109 Unspecified abdominal pain: Secondary | ICD-10-CM | POA: Diagnosis not present

## 2020-10-21 DIAGNOSIS — Z3202 Encounter for pregnancy test, result negative: Secondary | ICD-10-CM

## 2020-10-21 DIAGNOSIS — K5900 Constipation, unspecified: Secondary | ICD-10-CM

## 2020-10-21 DIAGNOSIS — K649 Unspecified hemorrhoids: Secondary | ICD-10-CM | POA: Diagnosis not present

## 2020-10-21 DIAGNOSIS — R198 Other specified symptoms and signs involving the digestive system and abdomen: Secondary | ICD-10-CM

## 2020-10-21 HISTORY — DX: Other specified symptoms and signs involving the digestive system and abdomen: R19.8

## 2020-10-21 HISTORY — DX: Constipation, unspecified: K59.00

## 2020-10-21 HISTORY — DX: Unspecified hemorrhoids: K64.9

## 2020-10-21 LAB — POC URINE PREG, ED: Preg Test, Ur: NEGATIVE

## 2020-10-21 MED ORDER — HYDROCORTISONE ACETATE 25 MG RE SUPP: 25.0000 mg | Freq: Two times a day (BID) | RECTAL | 0 refills | Status: AC | PRN

## 2020-10-21 NOTE — Discharge Instructions (Signed)
Drink plenty of water, increase fiber in diet, stop straining. Use stool softener and Miralax as label directed(both are over the counter meds). Use suppositories as directed. Follow up  with ER if new or worsening issues

## 2020-10-21 NOTE — ED Triage Notes (Signed)
Pt c/o constipation and blood in stools x 1.5 months. Pt states she always feels like she has to strain to have a BM. Pt states she has noticed blood in her stools and mucus. Pt states she has some lower abd pain.

## 2020-10-21 NOTE — ED Notes (Signed)
Pt requesting pregnancy test.

## 2020-10-21 NOTE — ED Provider Notes (Signed)
[facility]    CSN: [id number] Arrival date & time: 10/21/20  5638      History   Chief Complaint Chief Complaint  Patient presents with  . Constipation  . Blood In Stools    HPI Renee Crawford is a 19 y.o. female.   The history is provided by the patient. No language interpreter was used.  Constipation Time since last bowel movement:  6 weeks Timing:  Intermittent Progression:  Waxing and waning Chronicity:  Recurrent Context: not dehydration and not dietary changes   Stool description:  Formed, small and mucus containing Relieved by:  None tried Worsened by:  Nothing Ineffective treatments:  None tried Associated symptoms: flatus   Associated symptoms: no abdominal pain, no back pain, no diarrhea, no nausea and no vomiting   Risk factors: no recent travel     Past Medical History:  Diagnosis Date  . Anemia   . Infection    UTI    Patient Active Problem List   Diagnosis Date Noted  . Constipation 10/21/2020  . Straining during bowel movements 10/21/2020  . Hemorrhoids 10/21/2020  . Frequent UTI 03/10/2020  . Paroxysmal tachycardia (HCC) 01/02/2018  . 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 NO PAST SURGERIES      OB History    Gravida  1   Para  1   Term  1   Preterm      AB      Living  1     SAB      TAB      Ectopic      Multiple  0   Live Births  1            Home Medications    Prior to Admission medications   Medication Sig Start Date End Date Taking? Authorizing Provider  acetaminophen (TYLENOL) 325 MG tablet Take 2 tablets (650 mg total) by mouth every 6 (six) hours as needed for mild pain or moderate pain. 08/28/20   Sheila Oats, MD  hydrocortisone (ANUCORT-HC) 25 MG suppository Place 1 suppository (25 mg total) rectally 2 (two) times daily as needed for up to 6 days for hemorrhoids or anal itching. 10/21/20  10/27/20  Howard Patton, Para March, NP    Family History Family History  Problem Relation Age of Onset  . Asthma Mother   . Healthy Father     Social History Social History   Tobacco Use  . Smoking status: Former Smoker    Types: Cigarettes    Quit date: 01/28/2020    Years since quitting: 0.7  . Smokeless tobacco: Never Used  Vaping Use  . Vaping Use: Every day  Substance Use Topics  . Alcohol use: No  . Drug use: No     Allergies   Sulfa antibiotics, Amoxicillin-pot clavulanate, and Latex   Review of Systems Review of Systems  Gastrointestinal: Positive for blood in stool, constipation and flatus. Negative for abdominal pain, diarrhea, nausea and vomiting.  Musculoskeletal: Negative for back pain.  All other systems reviewed and are negative.    Physical Exam Triage Vital Signs ED Triage Vitals  Enc Vitals Group     BP 10/21/20 0902 115/73     Pulse Rate 10/21/20 0902 93     Resp --      Temp --      Temp src --      SpO2 10/21/20 0902  99 %     Weight --      Height --      Head Circumference --      Peak Flow --      Pain Score 10/21/20 0857 0     Pain Loc --      Pain Edu? --      Excl. in GC? --    No data found.  Updated Vital Signs BP 115/73 (BP Location: Left Arm)   Pulse 93   LMP 10/09/2020   SpO2 99%   Breastfeeding No   Visual Acuity Right Eye Distance:   Left Eye Distance:   Bilateral Distance:    Right Eye Near:   Left Eye Near:    Bilateral Near:     Physical Exam Vitals and nursing note reviewed. Exam conducted with a chaperone present.  Constitutional:      Appearance: Normal appearance. She is well-developed.  HENT:     Head: Normocephalic.     Right Ear: Tympanic membrane normal.     Left Ear: Tympanic membrane normal.     Nose: Nose normal.     Mouth/Throat:     Pharynx: Uvula midline.  Neck:     Trachea: Trachea normal.  Cardiovascular:     Rate and Rhythm: Normal rate and regular rhythm.     Pulses: Normal pulses.      Heart sounds: Normal heart sounds.  Pulmonary:     Effort: Pulmonary effort is normal.     Breath sounds: Normal breath sounds.  Abdominal:     General: Bowel sounds are normal.     Palpations: Abdomen is soft.     Tenderness: There is no abdominal tenderness.  Genitourinary:    Comments: + small ext hemorrhoid noted,no bleeding, reducible, rectal tone normal Musculoskeletal:     Cervical back: Full passive range of motion without pain.  Neurological:     General: No focal deficit present.     Mental Status: She is alert.     GCS: GCS eye subscore is 4. GCS verbal subscore is 5. GCS motor subscore is 6.     Cranial Nerves: No cranial nerve deficit.     Sensory: No sensory deficit.  Psychiatric:        Speech: Speech normal.        Behavior: Behavior is cooperative.      UC Treatments / Results  Labs (all labs ordered are listed, but only abnormal results are displayed) Labs Reviewed  POC URINE PREG, ED    EKG   Radiology DG Abdomen Acute W/Chest  Result Date: 10/21/2020 CLINICAL DATA:  Abdominal pain.  Constipation. EXAM: DG ABDOMEN ACUTE WITH 1 VIEW CHEST COMPARISON:  Chest x-ray 10/31/2008. FINDINGS: Mediastinum hilar structures normal. Lungs are clear. No pleural effusion or pneumothorax. Heart size normal. No acute bony abnormality identified. Soft tissues of the abdomen are unremarkable. Large amount of stool noted throughout the colon and rectum suggesting constipation. No bowel distention or free air. No acute bony abnormality. IMPRESSION: 1. No acute cardiopulmonary disease. 2. Large amount of stool noted throughout the colon and rectum suggesting constipation. No bowel distention. Electronically Signed   By: Maisie Fus  Register   On: 10/21/2020 10:16    Procedures Procedures (including critical care time)  Medications Ordered in UC Medications - No data to display  Initial Impression / Assessment and Plan / UC Course  I have reviewed the triage vital signs  and the nursing notes.  Pertinent labs &  imaging results that were available during my care of the patient were reviewed by me and considered in my medical decision making (see chart for details).   discussed results with pt and plan of care  Final Clinical Impressions(s) / UC Diagnoses   Final diagnoses:  Constipation, unspecified constipation type  Straining during bowel movements  Hemorrhoids, unspecified hemorrhoid type     Discharge Instructions     Drink plenty of water, increase fiber in diet, stop straining. Use stool softener and Miralax as label directed(both are over the counter meds). Use suppositories as directed. Follow up  with ER if new or worsening issues    ED Prescriptions    Medication Sig Dispense Auth. Provider   hydrocortisone (ANUCORT-HC) 25 MG suppository Place 1 suppository (25 mg total) rectally 2 (two) times daily as needed for up to 6 days for hemorrhoids or anal itching. 12 suppository Lakiya Cottam, Para March, NP     PDMP not reviewed this encounter.   Clancy Gourd, NP 10/21/20 1530

## 2020-11-22 ENCOUNTER — Encounter (HOSPITAL_COMMUNITY): Payer: Self-pay | Admitting: Obstetrics and Gynecology

## 2020-11-22 ENCOUNTER — Inpatient Hospital Stay (HOSPITAL_COMMUNITY): Payer: Medicaid Other

## 2020-11-22 ENCOUNTER — Inpatient Hospital Stay (HOSPITAL_COMMUNITY)
Admission: AD | Admit: 2020-11-22 | Discharge: 2020-11-22 | Disposition: A | Discharge status: Home / Self Care | Payer: Medicaid Other | Attending: Obstetrics and Gynecology | Admitting: Obstetrics and Gynecology

## 2020-11-22 ENCOUNTER — Other Ambulatory Visit: Payer: Self-pay

## 2020-11-22 DIAGNOSIS — Z880 Allergy status to penicillin: Secondary | ICD-10-CM | POA: Insufficient documentation

## 2020-11-22 DIAGNOSIS — Z87440 Personal history of urinary (tract) infections: Secondary | ICD-10-CM | POA: Diagnosis not present

## 2020-11-22 DIAGNOSIS — O3680X0 Pregnancy with inconclusive fetal viability, not applicable or unspecified: Secondary | ICD-10-CM | POA: Diagnosis not present

## 2020-11-22 DIAGNOSIS — R102 Pelvic and perineal pain: Secondary | ICD-10-CM | POA: Diagnosis present

## 2020-11-22 DIAGNOSIS — Z3A01 Less than 8 weeks gestation of pregnancy: Secondary | ICD-10-CM | POA: Diagnosis not present

## 2020-11-22 DIAGNOSIS — Z91040 Latex allergy status: Secondary | ICD-10-CM | POA: Diagnosis not present

## 2020-11-22 DIAGNOSIS — R109 Unspecified abdominal pain: Secondary | ICD-10-CM | POA: Diagnosis not present

## 2020-11-22 DIAGNOSIS — Z87891 Personal history of nicotine dependence: Secondary | ICD-10-CM | POA: Diagnosis not present

## 2020-11-22 DIAGNOSIS — Z596 Low income: Secondary | ICD-10-CM | POA: Insufficient documentation

## 2020-11-22 DIAGNOSIS — Z882 Allergy status to sulfonamides status: Secondary | ICD-10-CM | POA: Diagnosis not present

## 2020-11-22 DIAGNOSIS — O26891 Other specified pregnancy related conditions, first trimester: Secondary | ICD-10-CM

## 2020-11-22 LAB — WET PREP, GENITAL
Clue Cells Wet Prep HPF POC: NONE SEEN
Sperm: NONE SEEN
Trich, Wet Prep: NONE SEEN
Yeast Wet Prep HPF POC: NONE SEEN

## 2020-11-22 LAB — CBC
HCT: 35.9 % — ABNORMAL LOW (ref 36.0–46.0)
Hemoglobin: 11.4 g/dL — ABNORMAL LOW (ref 12.0–15.0)
MCH: 26.7 pg (ref 26.0–34.0)
MCHC: 31.8 g/dL (ref 30.0–36.0)
MCV: 84.1 fL (ref 80.0–100.0)
Platelets: 311 10*3/uL (ref 150–400)
RBC: 4.27 MIL/uL (ref 3.87–5.11)
RDW: 14.2 % (ref 11.5–15.5)
WBC: 13 10*3/uL — ABNORMAL HIGH (ref 4.0–10.5)
nRBC: 0 % (ref 0.0–0.2)

## 2020-11-22 LAB — COMPREHENSIVE METABOLIC PANEL
ALT: 24 U/L (ref 0–44)
AST: 21 U/L (ref 15–41)
Albumin: 3.5 g/dL (ref 3.5–5.0)
Alkaline Phosphatase: 76 U/L (ref 38–126)
Anion gap: 10 (ref 5–15)
BUN: 11 mg/dL (ref 6–20)
CO2: 25 mmol/L (ref 22–32)
Calcium: 9.3 mg/dL (ref 8.9–10.3)
Chloride: 102 mmol/L (ref 98–111)
Creatinine, Ser: 0.74 mg/dL (ref 0.44–1.00)
GFR, Estimated: >60 mL/min (ref >60)
Glucose, Bld: 84 mg/dL (ref 70–99)
Potassium: 3.8 mmol/L (ref 3.5–5.1)
Sodium: 137 mmol/L (ref 135–145)
Total Bilirubin: 0.3 mg/dL (ref 0.3–1.2)
Total Protein: 6.6 g/dL (ref 6.5–8.1)

## 2020-11-22 LAB — URINALYSIS, ROUTINE W REFLEX MICROSCOPIC
Bilirubin Urine: NEGATIVE
Glucose, UA: NEGATIVE mg/dL
Hgb urine dipstick: NEGATIVE
Ketones, ur: NEGATIVE mg/dL
Nitrite: NEGATIVE
Protein, ur: NEGATIVE mg/dL
Specific Gravity, Urine: 1.024 (ref 1.005–1.030)
pH: 5 (ref 5.0–8.0)

## 2020-11-22 LAB — POCT PREGNANCY, URINE: Preg Test, Ur: POSITIVE — AB

## 2020-11-22 LAB — HCG, QUANTITATIVE, PREGNANCY: hCG, Beta Chain, Quant, S: 36 m[IU]/mL — ABNORMAL HIGH (ref <5)

## 2020-11-22 NOTE — MAU Note (Signed)
Nausea, no vomiting. Pain  No bleeding.

## 2020-11-22 NOTE — MAU Provider Note (Signed)
Chief Complaint: Abdominal Pain   Event Date/Time   First Provider Initiated Contact with Patient 11/22/20 1426        SUBJECTIVE HPI: Renee Crawford is a 19 y.o. G2P1001 at 5229w2d by LMP who presents to maternity admissions reporting pelvic pain which moves from left to right, and nausea.  No vomiting.  She denies vaginal bleeding, vaginal itching/burning, urinary symptoms, h/a, dizziness, or fever/chills.    Abdominal Pain This is a new problem. The current episode started in the past 7 days. The onset quality is gradual. The problem occurs intermittently. The problem has been unchanged. The pain is located in the LLQ and RLQ. The pain is mild. The quality of the pain is cramping. The abdominal pain does not radiate. Associated symptoms include nausea. Pertinent negatives include no constipation, diarrhea, dysuria, fever, frequency or vomiting. Nothing aggravates the pain. The pain is relieved by nothing. She has tried nothing for the symptoms.   RN Note: Renee Crawford is a 19 y.o. at 8529w2d here in MAU reporting: lower abdominal cramping and nausea for the past week. States she feels pain on both sides of her lower abdomen but only feels pain on 1 side at a time. Denies bleeding. Had + UPT yesterday LMP: 10/09/20 approximately Onset of complaint: ongoing Pain score: 3/10     Vitals:   11/22/20 1354  BP: 122/86  Pulse: 97  Resp: 16  Temp: 98.2 F (36.8 C)  SpO2: 99%     Past Medical History:  Diagnosis Date  . Anemia   . Infection    UTI   Past Surgical History:  Procedure Laterality Date  . NO PAST SURGERIES     Social History   Socioeconomic History  . Marital status: Single    Spouse name: Not on file  . Number of children: 0  . Years of education: Not on file  . Highest education level: 10th grade  Occupational History  . Not on file  Tobacco Use  . Smoking status: Former Smoker    Types: Cigarettes    Quit date: 01/28/2020    Years since quitting: 0.8   . Smokeless tobacco: Never Used  Vaping Use  . Vaping Use: Every day  Substance and Sexual Activity  . Alcohol use: No  . Drug use: No  . Sexual activity: Yes    Birth control/protection: None  Other Topics Concern  . Not on file  Social History Narrative  . Not on file   Social Determinants of Health   Financial Resource Strain: Medium Risk  . Difficulty of Paying Living Expenses: Somewhat hard  Food Insecurity: Unknown  . Worried About Programme researcher, broadcasting/film/videounning Out of Food in the Last Year: Patient refused  . Ran Out of Food in the Last Year: Patient refused  Transportation Needs: No Transportation Needs  . Lack of Transportation (Medical): No  . Lack of Transportation (Non-Medical): No  Physical Activity: Sufficiently Active  . Days of Exercise per Week: 3 days  . Minutes of Exercise per Session: 100 min  Stress: Unknown  . Feeling of Stress : Patient refused  Social Connections: Socially Isolated  . Frequency of Communication with Friends and Family: Once a week  . Frequency of Social Gatherings with Friends and Family: Never  . Attends Religious Services: Never  . Active Member of Clubs or Organizations: No  . Attends BankerClub or Organization Meetings: Never  . Marital Status: Living with partner  Intimate Partner Violence: Not At Risk  . Fear of Current  or Ex-Partner: No  . Emotionally Abused: No  . Physically Abused: No  . Sexually Abused: No   No current facility-administered medications on file prior to encounter.   Current Outpatient Medications on File Prior to Encounter  Medication Sig Dispense Refill  . acetaminophen (TYLENOL) 325 MG tablet Take 2 tablets (650 mg total) by mouth every 6 (six) hours as needed for mild pain or moderate pain.     Allergies  Allergen Reactions  . Sulfa Antibiotics Anaphylaxis and Hives  . Amoxicillin-Pot Clavulanate Hives and Other (See Comments)  . Latex Rash    I have reviewed patient's Past Medical Hx, Surgical Hx, Family Hx, Social Hx,  medications and allergies.   ROS:  Review of Systems  Constitutional: Negative for fever.  Gastrointestinal: Positive for abdominal pain and nausea. Negative for constipation, diarrhea and vomiting.  Genitourinary: Negative for dysuria and frequency.   Review of Systems  Other systems negative   Physical Exam  Physical Exam Patient Vitals for the past 24 hrs:  BP Temp Temp src Pulse Resp SpO2  11/22/20 1354 122/86 98.2 F (36.8 C) Oral 97 16 99 %   Constitutional: Well-developed, well-nourished female in no acute distress.  Cardiovascular: normal rate Respiratory: normal effort GI: Abd soft, non-tender. Pos BS x 4 MS: Extremities nontender, no edema, normal ROM Neurologic: Alert and oriented x 4.  GU: Neg CVAT.  PELVIC EXAM: Bimanual exam: Cervix 0/long/high, firm, anterior, neg CMT, uterus nontender, nonenlarged, adnexa without tenderness, enlargement, or mass   LAB RESULTS Results for orders placed or performed during the hospital encounter of 11/22/20 (from the past 24 hour(s))  Pregnancy, urine POC     Status: Abnormal   Collection Time: 11/22/20  1:38 PM  Result Value Ref Range   Preg Test, Ur POSITIVE (A) NEGATIVE  Urinalysis, Routine w reflex microscopic Urine, Clean Catch     Status: Abnormal   Collection Time: 11/22/20  1:41 PM  Result Value Ref Range   Color, Urine YELLOW YELLOW   APPearance HAZY (A) CLEAR   Specific Gravity, Urine 1.024 1.005 - 1.030   pH 5.0 5.0 - 8.0   Glucose, UA NEGATIVE NEGATIVE mg/dL   Hgb urine dipstick NEGATIVE NEGATIVE   Bilirubin Urine NEGATIVE NEGATIVE   Ketones, ur NEGATIVE NEGATIVE mg/dL   Protein, ur NEGATIVE NEGATIVE mg/dL   Nitrite NEGATIVE NEGATIVE   Leukocytes,Ua TRACE (A) NEGATIVE   RBC / HPF 0-5 0 - 5 RBC/hpf   WBC, UA 0-5 0 - 5 WBC/hpf   Bacteria, UA RARE (A) NONE SEEN   Squamous Epithelial / LPF 0-5 0 - 5   Mucus PRESENT   Wet prep, genital     Status: Abnormal   Collection Time: 11/22/20  2:35 PM   Specimen:  Vaginal  Result Value Ref Range   Yeast Wet Prep HPF POC NONE SEEN NONE SEEN   Trich, Wet Prep NONE SEEN NONE SEEN   Clue Cells Wet Prep HPF POC NONE SEEN NONE SEEN   WBC, Wet Prep HPF POC MANY (A) NONE SEEN   Sperm NONE SEEN   CBC     Status: Abnormal   Collection Time: 11/22/20  2:57 PM  Result Value Ref Range   WBC 13.0 (H) 4.0 - 10.5 K/uL   RBC 4.27 3.87 - 5.11 MIL/uL   Hemoglobin 11.4 (L) 12.0 - 15.0 g/dL   HCT 63.8 (L) 45.3 - 64.6 %   MCV 84.1 80.0 - 100.0 fL   MCH 26.7 26.0 -  34.0 pg   MCHC 31.8 30.0 - 36.0 g/dL   RDW 42.3 53.6 - 14.4 %   Platelets 311 150 - 400 K/uL   nRBC 0.0 0.0 - 0.2 %  hCG, quantitative, pregnancy     Status: Abnormal   Collection Time: 11/22/20  2:57 PM  Result Value Ref Range   hCG, Beta Chain, Quant, S 36 (H) <5 mIU/mL  Comprehensive metabolic panel     Status: None   Collection Time: 11/22/20  2:57 PM  Result Value Ref Range   Sodium 137 135 - 145 mmol/L   Potassium 3.8 3.5 - 5.1 mmol/L   Chloride 102 98 - 111 mmol/L   CO2 25 22 - 32 mmol/L   Glucose, Bld 84 70 - 99 mg/dL   BUN 11 6 - 20 mg/dL   Creatinine, Ser 3.15 0.44 - 1.00 mg/dL   Calcium 9.3 8.9 - 40.0 mg/dL   Total Protein 6.6 6.5 - 8.1 g/dL   Albumin 3.5 3.5 - 5.0 g/dL   AST 21 15 - 41 U/L   ALT 24 0 - 44 U/L   Alkaline Phosphatase 76 38 - 126 U/L   Total Bilirubin 0.3 0.3 - 1.2 mg/dL   GFR, Estimated >86 >76 mL/min   Anion gap 10 5 - 15   s --/--/A POS (09/27 1310)  IMAGING US OB LESS THAN 14 WEEKS WITH OB TRANSVAGINAL  Result Date: 11/22/2020 CLINICAL DATA:  Cramping EXAM: OBSTETRIC <14 WK [place] AND TRANSVAGINAL OB US TECHNIQUE: Both transabdominal and transvaginal ultrasound examinations were performed for complete evaluation of the gestation as well as the maternal uterus, adnexal regions, and pelvic cul-de-sac. Transvaginal technique was performed to assess early pregnancy. COMPARISON:  12/09/2019. FINDINGS: Intrauterine gestational sac: None Yolk sac:  Not Visualized.  Embryo:  Not Visualized. Cardiac Activity: Not Visualized. Maternal uterus/adnexae: There is a trace amount of fluid within the fundal aspect of the endometrial canal without definite intrauterine gestational sac. Endometrial stripe is prominent measuring 17 mm in diameter. Complex left adnexal cyst with thin internal septations and layering debris. Cyst measures 4.8 x 4.8 x 4.0 cm. No internal vascularity. Cyst is likely intra-ovarian. Right ovary measures approximately 3.0 x 2.1 x 2.0 cm with probable corpus luteal cyst. Small-moderate volume of simple appearing free fluid within the cul-de-sac. IMPRESSION: 1. No definite evidence of an intrauterine pregnancy. There is a trace amount of fluid within the fundal aspect of the endometrial canal without definitive gestational sac. Recommend close clinical follow-up with serial beta HCG levels and short-term follow-up ultrasound. 2. Complex left adnexal cyst measuring up to 4.8 cm, and favored to represent a intraovarian hemorrhagic cyst. No follow up imaging of this finding recommended. Note: This recommendation does not apply to premenarchal patients or to those with increased risk (genetic, family history, elevated tumor markers or other high-risk factors) of ovarian cancer. Reference: Radiology 2019 Nov; 293(2):359-371. 3. Small-to-moderate volume of simple appearing free fluid within the cul-de-sac, which could be physiologic or related to ruptured cyst. Electronically Signed   By: Duanne Guess D.O.   On: 11/22/2020 16:00     MAU Management/MDM: Ordered usual first trimester r/o ectopic labs.   Pelvic exam and cultures done Will check baseline Ultrasound to rule out ectopic.  This bleeding/pain can represent a normal pregnancy with bleeding, spontaneous abortion or even an ectopic which can be life-threatening.  The process as listed above helps to determine which of these is present.  Lab results normal with HCG of 36  No pregnancy seen in  uterus Plan to repeat HCG in 48 hours.  ASSESSMENT Pregnancy at [redacted]w[redacted]d by LMP Pelvic pain in early pregnancy PRegnancy of unknown location  PLAN Discharge home Plan to repeat HCG level in 48 hours in clinic Will repeat  Ultrasound in about 7-10 days if HCG levels double appropriately  Ectopic precautions   Pt stable at time of discharge. Encouraged to return here if she develops worsening of symptoms, increase in pain, fever, or other concerning symptoms.    Wynelle Bourgeois CNM, MSN Certified Nurse-Midwife 11/22/2020  2:27 PM

## 2020-11-22 NOTE — Discharge Instructions (Signed)
Abdominal Pain During Pregnancy  Abdominal pain is common during pregnancy, and has many possible causes. Some causes are more serious than others, and sometimes the cause is not known. Abdominal pain can be a sign that labor is starting. It can also be caused by normal growth and stretching of muscles and ligaments during pregnancy. Always tell your health care provider if you have any abdominal pain. Follow these instructions at home:  Do not have sex or put anything in your vagina until your pain goes away completely.  Get plenty of rest until your pain improves.  Drink enough fluid to keep your urine pale yellow.  Take over-the-counter and prescription medicines only as told by your health care provider.  Keep all follow-up visits as told by your health care provider. This is important. Contact a health care provider if:  Your pain continues or gets worse after resting.  You have lower abdominal pain that: ? Comes and goes at regular intervals. ? Spreads to your back. ? Is similar to menstrual cramps.  You have pain or burning when you urinate. Get help right away if:  You have a fever or chills.  You have vaginal bleeding.  You are leaking fluid from your vagina.  You are passing tissue from your vagina.  You have vomiting or diarrhea that lasts for more than 24 hours.  Your baby is moving less than usual.  You feel very weak or faint.  You have shortness of breath.  You develop severe pain in your upper abdomen. Summary  Abdominal pain is common during pregnancy, and has many possible causes.  If you experience abdominal pain during pregnancy, tell your health care provider right away.  Follow your health care provider's home care instructions and keep all follow-up visits as directed. This information is not intended to replace advice given to you by your health care provider. Make sure you discuss any questions you have with your health care  provider. Document Revised: 03/05/2019 Document Reviewed: 02/17/2017 Elsevier Patient Education  2020 Elsevier Inc.  

## 2020-11-22 NOTE — MAU Note (Signed)
Renee Crawford is a 19 y.o. at [redacted]w[redacted]d here in MAU reporting: lower abdominal cramping and nausea for the past week. States she feels pain on both sides of her lower abdomen but only feels pain on 1 side at a time. Denies bleeding. Had + UPT yesterday  LMP: 10/09/20 approximately  Onset of complaint: ongoing  Pain score: 3/10  Vitals:   11/22/20 1354  BP: 122/86  Pulse: 97  Resp: 16  Temp: 98.2 F (36.8 C)  SpO2: 99%     Lab orders placed from triage: UA, UPT

## 2020-11-24 LAB — GC/CHLAMYDIA PROBE AMP (~~LOC~~) NOT AT ARMC
Chlamydia: NEGATIVE
Comment: NEGATIVE
Comment: NORMAL
Neisseria Gonorrhea: NEGATIVE

## 2020-11-27 ENCOUNTER — Telehealth: Payer: Self-pay | Admitting: Family Medicine

## 2020-11-27 NOTE — Telephone Encounter (Signed)
Called patient to get her rescheduled for her missed OB appointment. I asked her was she going to go back to [facility]. She replied no, and disconnected the call.

## 2020-12-12 ENCOUNTER — Inpatient Hospital Stay (HOSPITAL_COMMUNITY): Payer: Medicaid Other

## 2020-12-12 ENCOUNTER — Encounter (HOSPITAL_COMMUNITY): Payer: Self-pay | Admitting: Obstetrics and Gynecology

## 2020-12-12 ENCOUNTER — Inpatient Hospital Stay (HOSPITAL_COMMUNITY)
Admission: AD | Admit: 2020-12-12 | Discharge: 2020-12-12 | Disposition: A | Discharge status: Home / Self Care | Payer: Medicaid Other | Attending: Obstetrics and Gynecology | Admitting: Obstetrics and Gynecology

## 2020-12-12 ENCOUNTER — Other Ambulatory Visit: Payer: Self-pay

## 2020-12-12 DIAGNOSIS — R1032 Left lower quadrant pain: Secondary | ICD-10-CM

## 2020-12-12 DIAGNOSIS — O26891 Other specified pregnancy related conditions, first trimester: Secondary | ICD-10-CM

## 2020-12-12 DIAGNOSIS — Z3A09 9 weeks gestation of pregnancy: Secondary | ICD-10-CM | POA: Diagnosis not present

## 2020-12-12 DIAGNOSIS — N83209 Unspecified ovarian cyst, unspecified side: Secondary | ICD-10-CM

## 2020-12-12 DIAGNOSIS — O99891 Other specified diseases and conditions complicating pregnancy: Secondary | ICD-10-CM | POA: Diagnosis not present

## 2020-12-12 DIAGNOSIS — Z3A01 Less than 8 weeks gestation of pregnancy: Secondary | ICD-10-CM | POA: Diagnosis not present

## 2020-12-12 DIAGNOSIS — O3481 Maternal care for other abnormalities of pelvic organs, first trimester: Secondary | ICD-10-CM | POA: Insufficient documentation

## 2020-12-12 DIAGNOSIS — Z87891 Personal history of nicotine dependence: Secondary | ICD-10-CM | POA: Diagnosis not present

## 2020-12-12 DIAGNOSIS — R109 Unspecified abdominal pain: Secondary | ICD-10-CM

## 2020-12-12 DIAGNOSIS — N83202 Unspecified ovarian cyst, left side: Secondary | ICD-10-CM

## 2020-12-12 LAB — URINALYSIS, ROUTINE W REFLEX MICROSCOPIC
Bilirubin Urine: NEGATIVE
Glucose, UA: NEGATIVE mg/dL
Hgb urine dipstick: NEGATIVE
Ketones, ur: NEGATIVE mg/dL
Nitrite: NEGATIVE
Protein, ur: NEGATIVE mg/dL
Specific Gravity, Urine: 1.019 (ref 1.005–1.030)
pH: 7 (ref 5.0–8.0)

## 2020-12-12 LAB — COMPREHENSIVE METABOLIC PANEL
ALT: 16 U/L (ref 0–44)
AST: 18 U/L (ref 15–41)
Albumin: 3.6 g/dL (ref 3.5–5.0)
Alkaline Phosphatase: 68 U/L (ref 38–126)
Anion gap: 12 (ref 5–15)
BUN: 6 mg/dL (ref 6–20)
CO2: 21 mmol/L — ABNORMAL LOW (ref 22–32)
Calcium: 9.1 mg/dL (ref 8.9–10.3)
Chloride: 102 mmol/L (ref 98–111)
Creatinine, Ser: 0.6 mg/dL (ref 0.44–1.00)
GFR, Estimated: >60 mL/min (ref >60)
Glucose, Bld: 87 mg/dL (ref 70–99)
Potassium: 3.5 mmol/L (ref 3.5–5.1)
Sodium: 135 mmol/L (ref 135–145)
Total Bilirubin: 0.5 mg/dL (ref 0.3–1.2)
Total Protein: 6.7 g/dL (ref 6.5–8.1)

## 2020-12-12 LAB — CBC
HCT: 36.4 % (ref 36.0–46.0)
Hemoglobin: 11.5 g/dL — ABNORMAL LOW (ref 12.0–15.0)
MCH: 26.3 pg (ref 26.0–34.0)
MCHC: 31.6 g/dL (ref 30.0–36.0)
MCV: 83.3 fL (ref 80.0–100.0)
Platelets: 325 10*3/uL (ref 150–400)
RBC: 4.37 MIL/uL (ref 3.87–5.11)
RDW: 14.9 % (ref 11.5–15.5)
WBC: 9.5 10*3/uL (ref 4.0–10.5)
nRBC: 0 % (ref 0.0–0.2)

## 2020-12-12 LAB — TYPE AND SCREEN
ABO/RH(D): A POS
Antibody Screen: NEGATIVE

## 2020-12-12 LAB — HCG, QUANTITATIVE, PREGNANCY: hCG, Beta Chain, Quant, S: 68268 m[IU]/mL — ABNORMAL HIGH (ref <5)

## 2020-12-12 MED ORDER — ACETAMINOPHEN 500 MG PO TABS
1000.0000 mg | ORAL_TABLET | Freq: Once | ORAL | Status: AC
  Administered 2020-12-12: 1000 mg via ORAL
  Filled 2020-12-12: qty 2

## 2020-12-12 NOTE — MAU Provider Note (Signed)
History     [id number]  Arrival date and time: 12/12/20 1133    No chief complaint on file.    HPI Renee Crawford is a 20 y.o. at [redacted]w[redacted]d by LMP with PMHx notable for preeclampsia without severe features and a prior pregnancy, who presents for left lower quadrant abdominal pain.   Review of MAU visit on 11/22/2020: Patient seen in the MAU on November 22, 2020 for pelvic pain and nausea.  Pregnancy of unknown location work-up initiated, notable for hCG 36 and a transvaginal ultrasound which showed no definitive IUP and a complex left adnexal cyst measuring 4.8 cm in its greatest diameter.  Per review of the chart, patient was instructed to return in 2 days for repeat hCG and was also ordered for an outpatient ultrasound to assess viability in 7 to 10 days.  Appears that she was called about a week after her visit and hung up on support staff.  Today patient reports that she was never called and that she did not know what she was supposed to do.  She is continue to have abdominal pain so she decided to be present today.  Pain is the same, intermittent and cramping in the left lower quadrant.  She denies any vaginal bleeding.  She endorses some white vaginal discharge.  She denies any burning or pain with urination, nausea or vomiting, fevers.   --/--/A POS (01/14 1158)  OB History    Gravida  2   Para  1   Term  1   Preterm      AB      Living  1     SAB      IAB      Ectopic      Multiple  0   Live Births  1           Past Medical History:  Diagnosis Date  . Anemia   . Infection    UTI    Past Surgical History:  Procedure Laterality Date  . NO PAST SURGERIES      Family History  Problem Relation Age of Onset  . Asthma Mother   . Healthy Father     Social History   Socioeconomic History  . Marital status: Single    Spouse name: Not on file  . Number of children: 0  . Years of education: Not on file  . Highest education level: 10th grade   Occupational History  . Not on file  Tobacco Use  . Smoking status: Former Smoker    Types: Cigarettes    Quit date: 01/28/2020    Years since quitting: 0.8  . Smokeless tobacco: Never Used  Vaping Use  . Vaping Use: Every day  Substance and Sexual Activity  . Alcohol use: No  . Drug use: No  . Sexual activity: Yes    Birth control/protection: None  Other Topics Concern  . Not on file  Social History Narrative  . Not on file   Social Determinants of Health   Financial Resource Strain: Medium Risk  . Difficulty of Paying Living Expenses: Somewhat hard  Food Insecurity: Unknown  . Worried About Programme researcher, broadcasting/film/video in the Last Year: Patient refused  . Ran Out of Food in the Last Year: Patient refused  Transportation Needs: No Transportation Needs  . Lack of Transportation (Medical): No  . Lack of Transportation (Non-Medical): No  Physical Activity: Sufficiently Active  . Days of Exercise per Week: 3 days  .  Minutes of Exercise per Session: 100 min  Stress: Unknown  . Feeling of Stress : Patient refused  Social Connections: Socially Isolated  . Frequency of Communication with Friends and Family: Once a week  . Frequency of Social Gatherings with Friends and Family: Never  . Attends Religious Services: Never  . Active Member of Clubs or Organizations: No  . Attends Banker Meetings: Never  . Marital Status: Living with partner  Intimate Partner Violence: Not At Risk  . Fear of Current or Ex-Partner: No  . Emotionally Abused: No  . Physically Abused: No  . Sexually Abused: No    Allergies  Allergen Reactions  . Sulfa Antibiotics Anaphylaxis and Hives  . Amoxicillin-Pot Clavulanate Hives and Other (See Comments)  . Latex Rash    No current facility-administered medications on file prior to encounter.   Current Outpatient Medications on File Prior to Encounter  Medication Sig Dispense Refill  . acetaminophen (TYLENOL) 325 MG tablet Take 2 tablets (650  mg total) by mouth every 6 (six) hours as needed for mild pain or moderate pain.       ROS Pertinent positives and negative per HPI, all others reviewed and negative  Physical Exam   BP 134/82   Pulse (!) 102   Temp 98.2 F (36.8 C)   Resp 18   Ht 5\' 2"  (1.575 m)   Wt 94.8 kg   LMP 10/09/2020   BMI 38.23 kg/m   Physical Exam Vitals reviewed.  Constitutional:      General: She is not in acute distress.    Appearance: She is well-developed and well-nourished. She is not diaphoretic.  Eyes:     General: No scleral icterus. Pulmonary:     Effort: Pulmonary effort is normal. No respiratory distress.  Abdominal:     General: There is no distension.     Palpations: Abdomen is soft.     Tenderness: There is abdominal tenderness. There is no guarding or rebound.     Comments: Mild tenderness to palpation in the left lower quadrant, no rebound or guarding  Musculoskeletal:        General: No edema.  Skin:    General: Skin is warm and dry.  Neurological:     Mental Status: She is alert.     Coordination: Coordination normal.  Psychiatric:        Mood and Affect: Mood and affect normal.      Cervical Exam    Bedside Ultrasound Not done  My interpretation: n/a  FHT n/a  Labs Results for orders placed or performed during the hospital encounter of 12/12/20 (from the past 24 hour(s))  CBC     Status: Abnormal   Collection Time: 12/12/20 11:58 AM  Result Value Ref Range   WBC 9.5 4.0 - 10.5 K/uL   RBC 4.37 3.87 - 5.11 MIL/uL   Hemoglobin 11.5 (L) 12.0 - 15.0 g/dL   HCT 12/14/20 00.8 - 67.6 %   MCV 83.3 80.0 - 100.0 fL   MCH 26.3 26.0 - 34.0 pg   MCHC 31.6 30.0 - 36.0 g/dL   RDW 19.5 09.3 - 26.7 %   Platelets 325 150 - 400 K/uL   nRBC 0.0 0.0 - 0.2 %  Type and screen [facility]     Status: None   Collection Time: 12/12/20 11:58 AM  Result Value Ref Range   ABO/RH(D) A POS    Antibody Screen NEG    Sample Expiration  12/15/2020,2359 Performed at [facility], [address], [place], [place] [address]     Imaging [place] Transvaginal  Result Date: 12/12/2020 CLINICAL DATA:  Abdominal pain, pregnancy. EXAM: TRANSVAGINAL OB ULTRASOUND TECHNIQUE: Transvaginal ultrasound was performed for complete evaluation of the gestation as well as the maternal uterus, adnexal regions, and pelvic cul-de-sac. COMPARISON:  None. FINDINGS: Intrauterine gestational sac: Single Yolk sac:  Visualized. Embryo:  Visualized. Cardiac Activity: Visualized. Heart Rate: 110 bpm CRL:   5.4 mm   6 w 1 d                  [place] EDC: August 06, 2021. Subchorionic hemorrhage:  Small subchorionic hemorrhage is noted. Maternal uterus/adnexae: 5.6 cm left ovarian cyst is noted. Right ovary is unremarkable. No free fluid is noted. IMPRESSION: Single live intrauterine gestation of 6 weeks 1 day. Small subchorionic hemorrhage is noted. Electronically Signed   By: Lupita Raider M.D.   On: 12/12/2020 12:47    MAU Course  Procedures  Lab Orders     Urinalysis, Routine w reflex microscopic Urine, Clean Catch     CBC     Comprehensive metabolic panel     hCG, quantitative, pregnancy Meds ordered this encounter  Medications  . acetaminophen (TYLENOL) tablet 1,000 mg    Imaging Orders     US OB Transvaginal  MDM moderate  Assessment and Plan  #Pregnancy of unknown location #Abdominal pain in the first trimester pregnancy Patient presenting approximately 3 weeks after initiation of pregnancy of unknown location work-up.  We will repeat hCG and obtain ultrasound to see if we are able to determine pregnancy location at this time. 12:14 PM  Ultrasound shows IUP at 6 weeks and 1 day, dating updated, ectopic pregnancy ruled out.  Courage to establish prenatal care, given list of providers.  #Left ovarian cyst Left ovarian cyst seen again on repeat ultrasound, measuring slightly larger having gone from 4 to 5 cm.  Discussed with patient  that unless the pain becomes significantly greater we will usually not addressed these during pregnancy.  If she has significant abdominal pain on that side may be an indicator of ovarian torsion she should seek care immediately, otherwise we can deal with this after pregnancy.  Discharged home in stable condition.  Venora Maples, MD/MPH 12/12/20 1:18 PM

## 2020-12-12 NOTE — MAU Note (Signed)
Pt reports she has had abd pain for about a week. Was here last week  Did not see anything on U/S. Was told someone would call with an appointment. Denies ay vag bleeding reports a clear-creamy vag discharge.

## 2020-12-12 NOTE — Discharge Instructions (Signed)
Prenatal Care Providers           Center for [facility] @ [facility]  [address] [phone]  Center for [facility] @ [facility]   [address]  [phone]  [facility] For [facility] @ [facility]       [address] [phone]            Center for [facility] @ [place]     [phone]          Center for [facility] @ [facility]   [address] [phone]  Center for [facility] @ [facility]  [address] [phone]     Center for [facility] @ [address] [place])  [address]   [phone]     [facility]  Phone: [phone]  [facility]  Phone: [phone]  [facility] Phone: [phone]  [facility] Phone: [phone]  [facility] Phone: [phone]  [facility] Phone: [phone]  [facility]  Phone: [phone]    Abdominal Pain During Pregnancy Belly (abdominal) pain is common during pregnancy. There are many possible causes. Some causes are more serious than others. Sometimes the cause is not known. Always tell your doctor if you have belly pain. Follow these instructions at home:  Do not have sex or put anything in your vagina until your pain goes away completely.  Get plenty of rest until your pain gets better.  Drink enough fluid to keep your pee (urine) pale yellow.  Take over-the-counter and prescription medicines only as told by your doctor.  Keep all follow-up visits.   Contact a doctor if:  You keep having pain after resting.  Your pain gets worse after resting.  You have lower belly pain that: ? Comes and goes at regular times. ? Spreads to your back. ? Feels like menstrual cramps.  You have pain or burning when you pee (urinate). Get help right away if:  You have  a fever or chills.  You feel like it is hard to breathe.  You have bleeding from your vagina.  You are leaking fluid or tissue from your vagina.  You vomit for more than 24 hours.  You have watery poop (diarrhea) for more than 24 hours.  Your baby is moving less than usual.  You feel very weak or faint.  You have very bad pain in your upper belly. Summary  Belly pain is common during pregnancy. There are many possible causes.  If you have belly pain during pregnancy, tell your doctor right away.  Keep all follow-up visits. This information is not intended to replace advice given to you by your health care provider. Make sure you discuss any questions you have with your health care provider. Document Revised: 07/29/2020 Document Reviewed: 07/29/2020 Elsevier Patient Education  2021 [employer].

## 2020-12-12 NOTE — MAU Note (Signed)
Discharge instructions reviewed with patient.

## 2021-06-06 ENCOUNTER — Other Ambulatory Visit: Payer: Self-pay

## 2021-06-06 ENCOUNTER — Inpatient Hospital Stay (HOSPITAL_COMMUNITY): Payer: Medicaid Other

## 2021-06-06 ENCOUNTER — Encounter (HOSPITAL_COMMUNITY): Payer: Self-pay | Admitting: Obstetrics & Gynecology

## 2021-06-06 ENCOUNTER — Inpatient Hospital Stay (HOSPITAL_COMMUNITY)
Admission: AD | Admit: 2021-06-06 | Discharge: 2021-06-06 | Disposition: A | Discharge status: Home / Self Care | Payer: Medicaid Other | Attending: Obstetrics & Gynecology | Admitting: Obstetrics & Gynecology

## 2021-06-06 DIAGNOSIS — Z87440 Personal history of urinary (tract) infections: Secondary | ICD-10-CM | POA: Diagnosis not present

## 2021-06-06 DIAGNOSIS — O2341 Unspecified infection of urinary tract in pregnancy, first trimester: Secondary | ICD-10-CM | POA: Diagnosis not present

## 2021-06-06 DIAGNOSIS — R109 Unspecified abdominal pain: Secondary | ICD-10-CM

## 2021-06-06 DIAGNOSIS — O26891 Other specified pregnancy related conditions, first trimester: Secondary | ICD-10-CM

## 2021-06-06 DIAGNOSIS — Z87891 Personal history of nicotine dependence: Secondary | ICD-10-CM | POA: Diagnosis not present

## 2021-06-06 DIAGNOSIS — O3680X0 Pregnancy with inconclusive fetal viability, not applicable or unspecified: Secondary | ICD-10-CM | POA: Diagnosis not present

## 2021-06-06 DIAGNOSIS — Z3A01 Less than 8 weeks gestation of pregnancy: Secondary | ICD-10-CM

## 2021-06-06 LAB — URINALYSIS, ROUTINE W REFLEX MICROSCOPIC
Bilirubin Urine: NEGATIVE
Glucose, UA: NEGATIVE mg/dL
Ketones, ur: NEGATIVE mg/dL
Leukocytes,Ua: NEGATIVE
Nitrite: POSITIVE — AB
Protein, ur: 100 mg/dL — AB
Specific Gravity, Urine: 1.024 (ref 1.005–1.030)
WBC, UA: >50 WBC/hpf — ABNORMAL HIGH (ref 0–5)
pH: 5 (ref 5.0–8.0)

## 2021-06-06 LAB — RPR: RPR Ser Ql: NONREACTIVE

## 2021-06-06 LAB — HIV ANTIBODY (ROUTINE TESTING W REFLEX): HIV Screen 4th Generation wRfx: NONREACTIVE

## 2021-06-06 LAB — HCG, QUANTITATIVE, PREGNANCY: hCG, Beta Chain, Quant, S: 2267 m[IU]/mL — ABNORMAL HIGH (ref <5)

## 2021-06-06 LAB — HEPATITIS B SURFACE ANTIGEN: Hepatitis B Surface Ag: NONREACTIVE

## 2021-06-06 LAB — CBC
HCT: 36.6 % (ref 36.0–46.0)
Hemoglobin: 11.3 g/dL — ABNORMAL LOW (ref 12.0–15.0)
MCH: 24.9 pg — ABNORMAL LOW (ref 26.0–34.0)
MCHC: 30.9 g/dL (ref 30.0–36.0)
MCV: 80.6 fL (ref 80.0–100.0)
Platelets: 296 10*3/uL (ref 150–400)
RBC: 4.54 MIL/uL (ref 3.87–5.11)
RDW: 16.4 % — ABNORMAL HIGH (ref 11.5–15.5)
WBC: 9.8 10*3/uL (ref 4.0–10.5)
nRBC: 0 % (ref 0.0–0.2)

## 2021-06-06 LAB — WET PREP, GENITAL
Clue Cells Wet Prep HPF POC: NONE SEEN
Sperm: NONE SEEN
Trich, Wet Prep: NONE SEEN
Yeast Wet Prep HPF POC: NONE SEEN

## 2021-06-06 LAB — HEPATITIS C ANTIBODY: HCV Ab: NONREACTIVE

## 2021-06-06 LAB — POCT PREGNANCY, URINE: Preg Test, Ur: POSITIVE — AB

## 2021-06-06 MED ORDER — CEFADROXIL 500 MG PO CAPS: 500.0000 mg | ORAL_CAPSULE | Freq: Two times a day (BID) | ORAL | 0 refills | Status: AC

## 2021-06-06 NOTE — MAU Provider Note (Addendum)
History     CSN: [id number]  Arrival date and time: 06/06/21 0932   Event Date/Time   First Provider Initiated Contact with Patient 06/06/21 1019      Chief Complaint  Patient presents with   Abdominal Pain   Vaginal Discharge   HPI Renee Crawford is a 20 y.o. G3P1011 at [redacted]w[redacted]d who presents with abdominal pain, dysuria, and vaginal discharge. Symptoms started 2 days ago. Reports suprapubic cramping that is intermittent. Rates pain 4/10. No aggravating factors. Has been taking azo for symptoms so can't discern if having hematuria. Denies fever/chills. Hx of frequent UTIs. Also had pink mucoid discharge. No odor or vaginal irritation.   OB History     Gravida  3   Para  1   Term  1   Preterm      AB  1   Living  1      SAB  1   IAB      Ectopic      Multiple  0   Live Births  1           Past Medical History:  Diagnosis Date   Anemia    Infection    UTI    Past Surgical History:  Procedure Laterality Date   NO PAST SURGERIES      Family History  Problem Relation Age of Onset   Asthma Mother    Healthy Father     Social History   Tobacco Use   Smoking status: Former    Pack years: 0.00    Types: Cigarettes    Quit date: 01/28/2020    Years since quitting: 1.3   Smokeless tobacco: Never  Vaping Use   Vaping Use: Every day  Substance Use Topics   Alcohol use: No   Drug use: No    Allergies:  Allergies  Allergen Reactions   Sulfa Antibiotics Anaphylaxis and Hives   Amoxicillin-Pot Clavulanate Hives and Other (See Comments)   Latex Rash    Medications Prior to Admission  Medication Sig Dispense Refill Last Dose   acetaminophen (TYLENOL) 325 MG tablet Take 2 tablets (650 mg total) by mouth every 6 (six) hours as needed for mild pain or moderate pain.       Review of Systems  Constitutional: Negative.   Gastrointestinal:  Positive for abdominal pain. Negative for diarrhea, nausea and vomiting.  Genitourinary:  Positive for  dysuria and vaginal discharge. Negative for flank pain, frequency, hematuria, urgency and vaginal bleeding.  Physical Exam   Blood pressure 124/71, pulse (!) 103, temperature 98.2 F (36.8 C), temperature source Oral, resp. rate 15, last menstrual period 04/22/2021, SpO2 99 %, unknown if currently breastfeeding.  Physical Exam Vitals and nursing note reviewed.  Constitutional:      General: She is not in acute distress.    Appearance: She is well-developed.  HENT:     Head: Normocephalic and atraumatic.  Eyes:     General: No scleral icterus. Pulmonary:     Effort: Pulmonary effort is normal. No respiratory distress.  Abdominal:     General: Abdomen is flat.     Palpations: Abdomen is soft.     Tenderness: There is no abdominal tenderness. There is no right CVA tenderness or left CVA tenderness.  Skin:    General: Skin is warm and dry.  Neurological:     Mental Status: She is alert.  Psychiatric:        Mood and Affect: Mood normal.  Behavior: Behavior normal.    MAU Course  Procedures Results for orders placed or performed during the hospital encounter of 06/06/21 (from the past 24 hour(s))  Urinalysis, Routine w reflex microscopic     Status: Abnormal   Collection Time: 06/06/21  9:47 AM  Result Value Ref Range   Color, Urine ORANGE (A) YELLOW   APPearance HAZY (A) CLEAR   Specific Gravity, Urine 1.024 1.005 - 1.030   pH 5.0 5.0 - 8.0   Glucose, UA NEGATIVE NEGATIVE mg/dL   Hgb urine dipstick SMALL (A) NEGATIVE   Bilirubin Urine NEGATIVE NEGATIVE   Ketones, ur NEGATIVE NEGATIVE mg/dL   Protein, ur 951 (A) NEGATIVE mg/dL   Nitrite POSITIVE (A) NEGATIVE   Leukocytes,Ua NEGATIVE NEGATIVE   RBC / HPF 11-20 0 - 5 RBC/hpf   WBC, UA >50 (H) 0 - 5 WBC/hpf   Bacteria, UA RARE (A) NONE SEEN   Squamous Epithelial / LPF 6-10 0 - 5   WBC Clumps PRESENT    Mucus PRESENT    Non Squamous Epithelial 0-5 (A) NONE SEEN  Pregnancy, urine POC     Status: Abnormal    Collection Time: 06/06/21  9:53 AM  Result Value Ref Range   Preg Test, Ur POSITIVE (A) NEGATIVE  Wet prep, genital     Status: Abnormal   Collection Time: 06/06/21 10:42 AM   Specimen: PATH Cytology Cervicovaginal Ancillary Only  Result Value Ref Range   Yeast Wet Prep HPF POC NONE SEEN NONE SEEN   Trich, Wet Prep NONE SEEN NONE SEEN   Clue Cells Wet Prep HPF POC NONE SEEN NONE SEEN   WBC, Wet Prep HPF POC MANY (A) NONE SEEN   Sperm NONE SEEN   CBC     Status: Abnormal   Collection Time: 06/06/21 11:15 AM  Result Value Ref Range   WBC 9.8 4.0 - 10.5 K/uL   RBC 4.54 3.87 - 5.11 MIL/uL   Hemoglobin 11.3 (L) 12.0 - 15.0 g/dL   HCT 88.4 16.6 - 06.3 %   MCV 80.6 80.0 - 100.0 fL   MCH 24.9 (L) 26.0 - 34.0 pg   MCHC 30.9 30.0 - 36.0 g/dL   RDW 01.6 (H) 01.0 - 93.2 %   Platelets 296 150 - 400 K/uL   nRBC 0.0 0.0 - 0.2 %  hCG, quantitative, pregnancy     Status: Abnormal   Collection Time: 06/06/21 11:15 AM  Result Value Ref Range   hCG, Beta Chain, Quant, S 2,267 (H) <5 mIU/mL   US OB LESS THAN 14 WEEKS WITH OB TRANSVAGINAL  Result Date: 06/06/2021 CLINICAL DATA:  Abdominal pain. Six weeks and 3 days pregnant by last menstrual period. EXAM: OBSTETRIC <14 WK [place] AND TRANSVAGINAL OB US TECHNIQUE: Both transabdominal and transvaginal ultrasound examinations were performed for complete evaluation of the gestation as well as the maternal uterus, adnexal regions, and pelvic cul-de-sac. Transvaginal technique was performed to assess early pregnancy. COMPARISON:  12/12/2020 FINDINGS: Intrauterine gestational sac: Visualized Yolk sac:  Not visualized Embryo:  Not visualized MSD: 3.7 mm   5 w   0 d Subchorionic hemorrhage:  Small. Maternal uterus/adnexae: Normal appearing ovaries. No free peritoneal fluid. IMPRESSION: Small intrauterine gestational sac with no yolk sac, fetal pole, or cardiac activity yet visualized. The estimated gestational age by a mean sac diameter is 5 weeks and 0 days. Recommend  follow-up quantitative B-HCG levels and follow-up US in 14 days to assess viability. This recommendation follows SRU consensus guidelines: Diagnostic  Criteria for Nonviable Pregnancy Early in the First Trimester. Malva Limes Med 2013; 644:0347-42. Electronically Signed   By: Beckie Salts M.D.   On: 06/06/2021 12:19    MDM +UPT UA, wet prep, GC/chlamydia, CBC, ABO/Rh, quant hCG, and [place] today to rule out ectopic pregnancy which can be life threatening.   Patient with history of recurrent UTIs. U/a consistent with UTI. Will rx Duricef (pt has PCN allergy but reports taking cephalosporins without reaction). Will send urine for culture.   HCG today is 2267. Ultrasound shows probably IUGS with no yolk sac, no adnexal masses. Will bring patient back for stat HCG on Monday.   Dr. Debroah Loop notified of results & agrees with plan for repeat HCG.   Assessment and Plan   1. Pregnancy of unknown anatomic location  -reviewed SAB vs ectopic precautions & reasons to return to MAU -scheduled for stat HCG on Monday at [facility]  2. Abdominal pain during pregnancy in first trimester   3. UTI (urinary tract infection) during pregnancy, first trimester  -Rx Duricef -urine culture pending     Judeth Horn 06/06/2021, 1:13 PM

## 2021-06-06 NOTE — MAU Note (Signed)
Pt reports to mau with c/o increased vag dc, abd cramping and burning with urination.  Denies vag bleeding.

## 2021-06-06 NOTE — Discharge Instructions (Signed)
Return to care  If you have heavier bleeding that soaks through more than 2 pads per hour for an hour or more If you bleed so much that you feel like you might pass out or you do pass out If you have significant abdominal pain that is not improved with Tylenol   

## 2021-06-08 ENCOUNTER — Other Ambulatory Visit: Payer: Medicaid Other

## 2021-06-08 LAB — GC/CHLAMYDIA PROBE AMP (~~LOC~~) NOT AT ARMC
Chlamydia: NEGATIVE
Comment: NEGATIVE
Comment: NORMAL
Neisseria Gonorrhea: NEGATIVE

## 2021-06-27 ENCOUNTER — Other Ambulatory Visit: Payer: Self-pay

## 2021-06-27 ENCOUNTER — Ambulatory Visit
Admission: EM | Admit: 2021-06-27 | Discharge: 2021-06-27 | Disposition: A | Discharge status: Home / Self Care | Payer: Medicaid Other | Attending: Emergency Medicine | Admitting: Emergency Medicine

## 2021-06-27 DIAGNOSIS — U071 COVID-19: Secondary | ICD-10-CM | POA: Diagnosis not present

## 2021-06-27 DIAGNOSIS — B349 Viral infection, unspecified: Secondary | ICD-10-CM | POA: Diagnosis present

## 2021-06-27 LAB — SARS CORONAVIRUS 2 (TAT 6-24 HRS): SARS Coronavirus 2: POSITIVE — AB

## 2021-06-27 NOTE — ED Triage Notes (Signed)
Patient states that she has been having a cough, chills, loss of appetite, weakness x yesterday. No Covid testing. States that she did take tylenol and nighttime cold medicine.

## 2021-06-27 NOTE — ED Provider Notes (Signed)
[facility]    CSN: [id number] Arrival date & time: 06/27/21  1207      History   Chief Complaint Chief Complaint  Patient presents with   Cough    HPI Renee Crawford is a 20 y.o. female.  Patient presents with fever, chills, headache, body aches, fatigue since yesterday.  She denies sore throat, cough, shortness of breath, vomiting, diarrhea, or other symptoms.  She took Tylenol yesterday for her symptoms but none taken today.  No known exposure to COVID.  She is not vaccinated.  The history is provided by the patient and medical records.   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 Authorizing Provider  Burr Medico 150-35 MCG/24HR transdermal patch 1 patch once a week. 06/21/21  Yes [provider]  acetaminophen (TYLENOL) 325 MG tablet Take 2 tablets (650 mg total) by mouth every 6 (six) hours as needed for mild pain or moderate pain. 08/28/20   Sheila Oats, MD    Family History Family History  Problem Relation Age of Onset   Asthma Mother    Healthy Father     Social History Social History   Tobacco Use   Smoking status: Former    Types: Cigarettes    Quit date: 01/28/2020    Years since quitting: 1.4   Smokeless tobacco: Never  Vaping  Use   Vaping Use: Every day  Substance Use Topics   Alcohol use: No   Drug use: No     Allergies   Sulfa antibiotics, Amoxicillin-pot clavulanate, and Latex   Review of Systems Review of Systems  Constitutional:  Positive for chills, fatigue and fever.  HENT:  Negative for ear pain and sore throat.   Respiratory:  Negative for cough and shortness of breath.   Cardiovascular:  Negative for chest pain and palpitations.  Gastrointestinal:  Negative for abdominal pain, diarrhea and vomiting.  Skin:  Negative for color change and rash.  Neurological:  Positive for headaches. Negative for syncope.  All other systems reviewed and are negative.   Physical Exam Triage Vital Signs ED Triage Vitals  Enc Vitals Group     BP      Pulse      Resp      Temp      Temp src      SpO2      Weight      Height      Head Circumference  Peak Flow      Pain Score      Pain Loc      Pain Edu?      Excl. in GC?    No data found.  Updated Vital Signs BP 105/71 (BP Location: Left Arm)   Pulse 93   Temp 99.1 F (37.3 C) (Oral)   Resp 18   Ht 5\' 2"  (1.575 m)   Wt 195 lb (88.5 kg)   LMP 06/19/2021   SpO2 98%   Breastfeeding No   BMI 35.67 kg/m   Visual Acuity Right Eye Distance:   Left Eye Distance:   Bilateral Distance:    Right Eye Near:   Left Eye Near:    Bilateral Near:     Physical Exam Vitals and nursing note reviewed.  Constitutional:      General: She is not in acute distress.    Appearance: She is well-developed.  HENT:     Head: Normocephalic and atraumatic.     Right Ear: Tympanic membrane normal.     Left Ear: Tympanic membrane normal.     Nose: Nose normal.     Mouth/Throat:     Mouth: Mucous membranes are moist.     Pharynx: Oropharynx is clear.  Eyes:     Conjunctiva/sclera: Conjunctivae normal.  Cardiovascular:     Rate and Rhythm: Normal rate and regular rhythm.     Heart sounds: Normal heart sounds.  Pulmonary:     Effort: Pulmonary  effort is normal. No respiratory distress.     Breath sounds: Normal breath sounds.  Abdominal:     Palpations: Abdomen is soft.     Tenderness: There is no abdominal tenderness.  Musculoskeletal:     Cervical back: Neck supple.  Skin:    General: Skin is warm and dry.  Neurological:     General: No focal deficit present.     Mental Status: She is alert and oriented to person, place, and time.     Gait: Gait normal.  Psychiatric:        Mood and Affect: Mood normal.        Behavior: Behavior normal.     UC Treatments / Results  Labs (all labs ordered are listed, but only abnormal results are displayed) Labs Reviewed  SARS CORONAVIRUS 2 (TAT 6-24 HRS)    EKG   Radiology No results found.  Procedures Procedures (including critical care time)  Medications Ordered in UC Medications - No data to display  Initial Impression / Assessment and Plan / UC Course  I have reviewed the triage vital signs and the nursing notes.  Pertinent labs & imaging results that were available during my care of the patient were reviewed by me and considered in my medical decision making (see chart for details).   Viral illness.  COVID pending.  Instructed patient to self quarantine per CDC guidelines.  Discussed symptomatic treatment including Tylenol or ibuprofen, rest, hydration.  Instructed patient to follow up with PCP if symptoms are not improving.  Patient agrees to plan of care.    Final Clinical Impressions(s) / UC Diagnoses   Final diagnoses:  Viral illness     Discharge Instructions      Your COVID test is pending.  You should self quarantine until the test result is back.    Take Tylenol or ibuprofen as needed for fever or discomfort.  Rest and keep yourself hydrated.    Follow-up with your primary care provider if your symptoms  are not improving.         ED Prescriptions   None    PDMP not reviewed this encounter.   Mickie Bail, NP 06/27/21 1250

## 2021-06-27 NOTE — Discharge Instructions (Addendum)
Your COVID test is pending.  You should self quarantine until the test result is back.    Take Tylenol or ibuprofen as needed for fever or discomfort.  Rest and keep yourself hydrated.    Follow-up with your primary care provider if your symptoms are not improving.     

## 2021-08-04 ENCOUNTER — Encounter: Payer: Self-pay | Admitting: Adult Health

## 2021-08-04 ENCOUNTER — Other Ambulatory Visit: Payer: Self-pay

## 2021-08-04 ENCOUNTER — Ambulatory Visit (INDEPENDENT_AMBULATORY_CARE_PROVIDER_SITE_OTHER): Payer: Medicaid Other | Admitting: Adult Health

## 2021-08-04 VITALS — BP 123/76 | HR 91 | Ht 65.0 in | Wt 192.6 lb

## 2021-08-04 DIAGNOSIS — Z3045 Encounter for surveillance of transdermal patch hormonal contraceptive device: Secondary | ICD-10-CM

## 2021-08-04 DIAGNOSIS — Z3009 Encounter for other general counseling and advice on contraception: Secondary | ICD-10-CM | POA: Insufficient documentation

## 2021-08-04 DIAGNOSIS — Z3202 Encounter for pregnancy test, result negative: Secondary | ICD-10-CM | POA: Diagnosis not present

## 2021-08-04 HISTORY — DX: Encounter for surveillance of transdermal patch hormonal contraceptive device: Z30.45

## 2021-08-04 HISTORY — DX: Encounter for other general counseling and advice on contraception: Z30.09

## 2021-08-04 LAB — POCT URINE PREGNANCY: Preg Test, Ur: NEGATIVE

## 2021-08-04 MED ORDER — XULANE 150-35 MCG/24HR TD PTWK: 1.0000 | MEDICATED_PATCH | TRANSDERMAL | 12 refills | Status: DC

## 2021-08-04 NOTE — Progress Notes (Signed)
  Subjective:     Patient ID: Renee Crawford, female   DOB: 04/08/2001, 20 y.o.   MRN: [id number]  HPI Renee Crawford is a 20 year old white female,single, G3P1011, in to get refills on the patch. She says she like it, but after it is off a week the first day on, legs feel achy. She has lost about 18 lbs recently.  Review of Systems Patient denies any headaches, hearing loss, fatigue, blurred vision, shortness of breath, chest pain, abdominal pain, problems with bowel movements, urination, or intercourse. No joint pain or mood swings.  Legs ache first day on patch Reviewed past medical,surgical, social and family history. Reviewed medications and allergies.     Objective:   Physical Exam BP 123/76 (BP Location: Left Arm, Patient Position: Sitting, Cuff Size: Normal)   Pulse 91   Ht 5\' 5"  (1.651 m)   Wt 192 lb 9.6 oz (87.4 kg)   LMP 07/31/2021 (Within Days)   Breastfeeding No   BMI 32.05 kg/m  UPT is negative. Skin warm and dry.  Lungs: clear to ausculation bilaterally. Cardiovascular: regular rate and rhythm.      Upstream - 08/04/21 1211       Pregnancy Intention Screening   Does the patient want to become pregnant in the next year? No    Does the patient's partner want to become pregnant in the next year? No    Would the patient like to discuss contraceptive options today? Yes      Contraception Wrap Up   Current Method Contraceptive Patch    End Method Contraceptive Patch;Female Condom    Contraception Counseling Provided Yes             Assessment:     1. General counselling and advice on contraception She is on the patch and wants to continue   2. Encounter for surveillance of transdermal patch hormonal contraceptive device Meds ordered this encounter  Medications   XULANE 150-35 MCG/24HR transdermal patch    Sig: Place 1 patch onto the skin once a week.    Dispense:  3 patch    Refill:  12    Order Specific Question:   Supervising Provider    Answer:   10/04/21 [[id number]]   Advised to to lose some more weight,as patch weight sensitive and use condoms    Plan:     Follow up in 1 year

## 2021-09-10 ENCOUNTER — Telehealth: Payer: Self-pay

## 2021-09-10 NOTE — Telephone Encounter (Signed)
2nd attempt at contacting pt to discuss samples. No answer, left vm

## 2021-11-19 ENCOUNTER — Ambulatory Visit: Payer: Medicaid Other | Admitting: Adult Health

## 2021-11-20 ENCOUNTER — Telehealth: Payer: Medicaid Other | Admitting: Obstetrics & Gynecology

## 2021-12-16 ENCOUNTER — Other Ambulatory Visit: Payer: Self-pay

## 2021-12-16 ENCOUNTER — Ambulatory Visit
Admission: RE | Admit: 2021-12-16 | Discharge: 2021-12-16 | Disposition: A | Discharge status: Home / Self Care | Payer: Medicaid Other | Source: Ambulatory Visit | Attending: Internal Medicine | Admitting: Internal Medicine

## 2021-12-16 VITALS — BP 114/70 | HR 72 | Temp 98.6°F

## 2021-12-16 DIAGNOSIS — R300 Dysuria: Secondary | ICD-10-CM

## 2021-12-16 DIAGNOSIS — N390 Urinary tract infection, site not specified: Secondary | ICD-10-CM | POA: Diagnosis not present

## 2021-12-16 LAB — POCT URINALYSIS DIP (MANUAL ENTRY)
Blood, UA: NEGATIVE
Glucose, UA: 100 mg/dL — AB
Nitrite, UA: POSITIVE — AB
Protein Ur, POC: 30 mg/dL — AB
Spec Grav, UA: 1.02 (ref 1.010–1.025)
Urobilinogen, UA: 2 E.U./dL — AB
pH, UA: 5 (ref 5.0–8.0)

## 2021-12-16 LAB — POCT URINE PREGNANCY: Preg Test, Ur: NEGATIVE

## 2021-12-16 MED ORDER — FLUCONAZOLE 150 MG PO TABS: ORAL_TABLET | ORAL | 0 refills | Status: DC

## 2021-12-16 MED ORDER — CIPROFLOXACIN HCL 500 MG PO TABS: 500.0000 mg | ORAL_TABLET | Freq: Two times a day (BID) | ORAL | 0 refills | Status: DC

## 2021-12-16 NOTE — ED Provider Notes (Addendum)
[facility]    CSN: [id number] Arrival date & time: 12/16/21  1209    HISTORY   Chief Complaint  Patient presents with   Dysuria   appt 130   HPI Renee Crawford is a 21 y.o. female. Patient presents to urgent care for evaluation of dysuria for the past 2 weeks.  Patient states that at the initial onset of her symptoms, she contacted her OB who sent her prescription for Bactrim and Diflucan to her pharmacy.  Patient states she was unable to get an appointment due to there being closed, as result, no urine culture was performed.  Patient states she took the 5-day course of Bactrim without incident and took the Diflucan as prescribed as well.  Patient states she is sexually active, states she feels like she gets a urinary tract infection every time she has sex with her partner who she states is well endowed.  Patient states sometimes she feels like there is pressure against her uterus during intercourse but denies frank dyspareunia.  Patient states she has been taking Azo for relief of her symptoms but this is no longer helping.  When asked, patient states that she routinely does not sit down when using toilets other than her own.  Urine dip today is positive for nitrites.  The history is provided by the patient.  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 Authorizing Provider  ciprofloxacin (CIPRO) 500 MG tablet Take 1 tablet (500 mg total) by mouth every 12 (twelve) hours for 5 days. 12/16/21 12/21/21 Yes Theadora Rama Scales, PA-C  fluconazole (DIFLUCAN) 150 MG tablet Take 1 tablet on day 2 of antibiotics.  Take second tablet 3 days later. 12/16/21  Yes Theadora Rama Scales, PA-C  Burr Medico 150-35 MCG/24HR transdermal patch Place 1 patch onto the skin once a week. 08/04/21   Adline Potter, NP   Family History Family History  Problem Relation Age of Onset   Healthy Father    Asthma Mother    Social History Social History   Tobacco Use   Smoking status: Former    Types: Cigarettes    Quit date: 01/28/2020    Years since quitting: 1.8   Smokeless tobacco: Never  Vaping Use   Vaping Use: Every day  Substance Use Topics   Alcohol use: No   Drug use: No   Allergies  Latex  Review of Systems Review of Systems Pertinent findings noted in history of present illness.   Physical Exam Triage Vital Signs ED Triage Vitals  Enc Vitals Group     BP 09/25/21 0827 (!) 147/82     Pulse Rate 09/25/21 0827 72     Resp 09/25/21 0827 18     Temp 09/25/21 0827 98.3 F (36.8 C)     Temp Source 09/25/21 0827 Oral     SpO2 09/25/21 0827 98 %     Weight --      Height --      Head Circumference --      Peak Flow --      Pain Score 09/25/21 0826 5     Pain Loc --      Pain Edu? --      Excl. in GC? --   No data found.  Updated Vital Signs BP 114/70    Pulse 72    Temp 98.6 F (37 C) (Oral)    LMP 11/14/2021    SpO2 97%   Physical Exam Vitals and nursing note reviewed.  Constitutional:      General: She is not in acute distress.    Appearance: Normal appearance. She is not ill-appearing.  HENT:      Head: Normocephalic and atraumatic.  Eyes:     General: Lids are normal.        Right eye: No discharge.        Left eye: No discharge.     Extraocular Movements: Extraocular movements intact.     Conjunctiva/sclera: Conjunctivae normal.     Right eye: Right conjunctiva is not injected.     Left eye: Left conjunctiva is not injected.  Neck:     Trachea: Trachea and phonation normal.  Cardiovascular:     Rate and Rhythm: Normal rate and regular rhythm.     Pulses: Normal pulses.     Heart sounds: Normal heart sounds. No murmur heard.   No friction rub. No gallop.  Pulmonary:     Effort: Pulmonary effort is normal. No accessory muscle usage, prolonged expiration or respiratory distress.     Breath sounds: Normal breath sounds. No stridor, decreased air movement or transmitted upper airway sounds. No decreased breath sounds, wheezing, rhonchi or rales.  Chest:     Chest wall: No tenderness.  Abdominal:     General: Abdomen is flat. Bowel sounds are normal. There is no distension.     Palpations: Abdomen is soft.     Tenderness: There is abdominal tenderness in the suprapubic area. There is no right CVA tenderness or left CVA tenderness.     Hernia: No hernia is present.  Musculoskeletal:        General: Normal range of motion.     Cervical back: Normal range of motion and neck supple. Normal range of motion.  Lymphadenopathy:     Cervical: No cervical adenopathy.  Skin:    General: Skin is warm and dry.     Findings: No erythema or rash.  Neurological:     General: No focal deficit present.     Mental Status: She is alert and oriented to person, place, and time.  Psychiatric:        Mood and Affect: Mood normal.        Behavior: Behavior normal.    Visual Acuity Right Eye Distance:   Left Eye Distance:   Bilateral Distance:    Right Eye Near:  Left Eye Near:    Bilateral Near:     UC Couse / Diagnostics / Procedures:    EKG  Radiology No results  found.  Procedures Procedures (including critical care time)  UC Diagnoses / Final Clinical Impressions(s)   I have reviewed the triage vital signs and the nursing notes.  Pertinent labs & imaging results that were available during my care of the patient were reviewed by me and considered in my medical decision making (see chart for details).    Final diagnoses:  Dysuria  Acute urinary tract infection   Urine dip today was positive for nitrites.  Urine culture will be performed per our protocol.  Patient advised that they will be contacted with results and that adjustments to treatment will be provided as indicated based on the results.  Because patient has already completed a full course of Bactrim, will provide patient with a prescription for ciprofloxacin.  Patient was also provided with a prescription for fluconazole given propensity of ciprofloxacin to cause vaginal yeast infections.  Patient was advised of possibility that urine culture results may be negative if sample provided was obtained late in the day causing urine to be more diluted.  Patient was advised that if antibiotics were effective after the first 24 to 36 hours, despite negative urine culture result, it is recommended that they complete the full course as prescribed.  Return precautions advised.  Drug allergies reviewed, all questions addressed.  ED Prescriptions     Medication Sig Dispense Auth. Provider   ciprofloxacin (CIPRO) 500 MG tablet Take 1 tablet (500 mg total) by mouth every 12 (twelve) hours for 5 days. 10 tablet Theadora RamaMorgan, Altariq Goodall Scales, PA-C   fluconazole (DIFLUCAN) 150 MG tablet Take 1 tablet on day 2 of antibiotics.  Take second tablet 3 days later. 2 tablet Theadora RamaMorgan, Muntaha Vermette Scales, PA-C      PDMP not reviewed this encounter.  Pending results:  Labs Reviewed  POCT URINALYSIS DIP (MANUAL ENTRY) - Abnormal; Notable for the following components:      Result Value   Color, UA orange (*)    Clarity, UA  cloudy (*)    Glucose, UA =100 (*)    Bilirubin, UA small (*)    Ketones, POC UA trace (5) (*)    Protein Ur, POC =30 (*)    Urobilinogen, UA 2.0 (*)    Nitrite, UA Positive (*)    Leukocytes, UA Large (3+) (*)    All other components within normal limits  URINE CULTURE  POCT URINE PREGNANCY  CERVICOVAGINAL ANCILLARY ONLY    Medications Ordered in UC: Medications - No data to display  Disposition Upon Discharge:  Condition: stable for discharge home Home: take medications as prescribed; routine discharge instructions as discussed; follow up as advised.  Patient presented with an acute illness with associated systemic symptoms and significant discomfort requiring urgent management. In my opinion, this is a condition that a prudent lay person (someone who possesses an average knowledge of health and medicine) may potentially expect to result in complications if not addressed urgently such as respiratory distress, impairment of bodily function or dysfunction of bodily organs.   Routine symptom specific, illness specific and/or disease specific instructions were discussed with the patient and/or caregiver at length.   As such, the patient has been evaluated and assessed, work-up was performed and treatment was provided in alignment with urgent care protocols and evidence based medicine.  Patient/parent/caregiver has been advised that the patient may require follow up for  further testing and treatment if the symptoms continue in spite of treatment, as clinically indicated and appropriate.  Patient/parent/caregiver has been advised to return to the [facility] or PCP in 3-5 days if no better; to PCP or the Emergency Department if new signs and symptoms develop, or if the current signs or symptoms continue to change or worsen for further workup, evaluation and treatment as clinically indicated and appropriate  The patient will follow up with their current PCP if and as advised. If the patient does not  currently have a PCP we will assist them in obtaining one.   The patient may need specialty follow up if the symptoms continue, in spite of conservative treatment and management, for further workup, evaluation, consultation and treatment as clinically indicated and appropriate.  Patient/parent/caregiver verbalized understanding and agreement of plan as discussed.  All questions were addressed during visit.  Please see discharge instructions below for further details of plan.  Discharge Instructions:   Discharge Instructions      The results of your STD testing today will be made available to you once received.  They will initially be posted to your MyChart and, if any of your results are abnormal, you will receive a phone call with those results along with further instructions regarding treatment.  The urinalysis that we performed today was abnormal.  Urine culture will be performed per our protocol.  The results of urine culture should be available in the next 3 to 5 days and will be posted to your MyChart account.  If there are any abnormal results, you will be contacted by phone and if further treatment is needed, this will be provided for you.  If you were advised to begin antibiotics today, it is because you are having active symptoms of a urinary tract infection.  It is important that you complete the course of antibiotics as prescribed despite the results of your urine culture.    The most common reason for a negative urine culture, despite having active symptoms of urinary tract infection, is that a truly reliable urine sample can only be obtained with the first urination of the day.  That early morning urine sample is very concentrated so it is most likely to have a significant amount of bacteria, enough to provide a positive urine culture.  Throughout the day, as you drink fluids and consume foods, your urine becomes more diluted.  Diluted urine decreases the likelihood of a negative  urine culture.  Therefore, if you begin to have significant relief of your symptoms when you begin the antibiotics but receive a phone call stating that your urine culture was negative, please trust the improvement of your symptoms as a sign that the antibiotics have been effective and that you should complete the entire course.  Because we all know that antibiotics can cause vaginal yeast infections, have also provided with a prescription for Diflucan, please take the first tablet on day 2 or 3 of ciprofloxacin and then take the second tablet 3 days after the first tablet.  If you have not had complete relief of your symptoms in the next 5 days, please return for follow-up, repeat urinalysis and repeat urine culture.  Thank you for visiting urgent care.    This office note has been dictated using Teaching laboratory technician.  Unfortunately, and despite my best efforts, this method of dictation can sometimes lead to occasional typographical or grammatical errors.  I apologize in advance if this occurs.  Theadora Rama Scales, PA-C 12/16/21 1438    Theadora Rama Scales, PA-C 12/16/21 1553

## 2021-12-16 NOTE — Discharge Instructions (Signed)
The results of your STD testing today will be made available to you once received.  They will initially be posted to your MyChart and, if any of your results are abnormal, you will receive a phone call with those results along with further instructions regarding treatment.  The urinalysis that we performed today was abnormal.  Urine culture will be performed per our protocol.  The results of urine culture should be available in the next 3 to 5 days and will be posted to your MyChart account.  If there are any abnormal results, you will be contacted by phone and if further treatment is needed, this will be provided for you.  If you were advised to begin antibiotics today, it is because you are having active symptoms of a urinary tract infection.  It is important that you complete the course of antibiotics as prescribed despite the results of your urine culture.    The most common reason for a negative urine culture, despite having active symptoms of urinary tract infection, is that a truly reliable urine sample can only be obtained with the first urination of the day.  That early morning urine sample is very concentrated so it is most likely to have a significant amount of bacteria, enough to provide a positive urine culture.  Throughout the day, as you drink fluids and consume foods, your urine becomes more diluted.  Diluted urine decreases the likelihood of a negative urine culture.  Therefore, if you begin to have significant relief of your symptoms when you begin the antibiotics but receive a phone call stating that your urine culture was negative, please trust the improvement of your symptoms as a sign that the antibiotics have been effective and that you should complete the entire course.  Because we all know that antibiotics can cause vaginal yeast infections, have also provided with a prescription for Diflucan, please take the first tablet on day 2 or 3 of ciprofloxacin and then take the second  tablet 3 days after the first tablet.  If you have not had complete relief of your symptoms in the next 5 days, please return for follow-up, repeat urinalysis and repeat urine culture.  Thank you for visiting urgent care.

## 2021-12-16 NOTE — ED Triage Notes (Addendum)
Patient presents to [facility] for evaluation of dysuria x 2 weeks, has taken one round of antibiotics without sample or culture without relief of symptoms.  Is sexualy active, unsure about discharge.  Has been taking AZO

## 2021-12-18 ENCOUNTER — Telehealth (HOSPITAL_COMMUNITY): Payer: Self-pay | Admitting: Emergency Medicine

## 2021-12-18 LAB — URINE CULTURE: Culture: >=100000 — AB

## 2021-12-18 LAB — CERVICOVAGINAL ANCILLARY ONLY
Bacterial Vaginitis (gardnerella): POSITIVE — AB
Candida Glabrata: NEGATIVE
Candida Vaginitis: POSITIVE — AB
Chlamydia: NEGATIVE
Comment: NEGATIVE
Comment: NEGATIVE
Comment: NEGATIVE
Comment: NEGATIVE
Comment: NEGATIVE
Comment: NORMAL
Neisseria Gonorrhea: NEGATIVE
Trichomonas: NEGATIVE

## 2021-12-18 MED ORDER — NITROFURANTOIN MONOHYD MACRO 100 MG PO CAPS: 100.0000 mg | ORAL_CAPSULE | Freq: Two times a day (BID) | ORAL | 0 refills | Status: DC

## 2021-12-18 MED ORDER — METRONIDAZOLE 500 MG PO TABS: 500.0000 mg | ORAL_TABLET | Freq: Two times a day (BID) | ORAL | 0 refills | Status: DC

## 2022-01-29 ENCOUNTER — Ambulatory Visit: Admit: 2022-01-29 | Discharge status: Absent without leave | Payer: Medicaid Other

## 2022-06-25 ENCOUNTER — Other Ambulatory Visit: Payer: Self-pay

## 2022-06-25 ENCOUNTER — Emergency Department (HOSPITAL_BASED_OUTPATIENT_CLINIC_OR_DEPARTMENT_OTHER)
Admission: EM | Admit: 2022-06-25 | Discharge: 2022-06-25 | Disposition: A | Discharge status: Home / Self Care | Payer: Medicaid Other | Attending: Emergency Medicine | Admitting: Emergency Medicine

## 2022-06-25 ENCOUNTER — Encounter (HOSPITAL_BASED_OUTPATIENT_CLINIC_OR_DEPARTMENT_OTHER): Payer: Self-pay | Admitting: Emergency Medicine

## 2022-06-25 DIAGNOSIS — L03317 Cellulitis of buttock: Secondary | ICD-10-CM

## 2022-06-25 DIAGNOSIS — Z91040 Latex allergy status: Secondary | ICD-10-CM | POA: Diagnosis not present

## 2022-06-25 MED ORDER — CEFTRIAXONE SODIUM 1 G IJ SOLR
1.0000 g | Freq: Once | INTRAMUSCULAR | Status: AC
  Administered 2022-06-25: 1 g via INTRAMUSCULAR
  Filled 2022-06-25: qty 10

## 2022-06-25 MED ORDER — LIDOCAINE HCL (PF) 1 % IJ SOLN
INTRAMUSCULAR | Status: AC
  Administered 2022-06-25: 2.1 mL
  Filled 2022-06-25: qty 5

## 2022-06-25 MED ORDER — SULFAMETHOXAZOLE-TRIMETHOPRIM 800-160 MG PO TABS: 1.0000 | ORAL_TABLET | Freq: Two times a day (BID) | ORAL | 0 refills | Status: DC

## 2022-06-25 NOTE — ED Notes (Signed)
Dc instructions and scripts reviewed with pt no questions or concerns at this time.  

## 2022-06-25 NOTE — ED Provider Notes (Signed)
[facility] EMERGENCY DEPARTMENT Provider Note   CSN: [id number] Arrival date & time: 06/25/22  2013     History  Chief Complaint  Patient presents with   Abscess    Renee Crawford is a 21 y.o. female.  Pt complains of a red swollen area left buttock.    The history is provided by the patient. No language interpreter was used.  Abscess Abscess location: buttock. Size:  20 Abscess quality: redness and warmth   Red streaking: no   Duration:  2 days Progression:  Worsening Chronicity:  New Context: not diabetes   Relieved by:  Nothing Worsened by:  Nothing Ineffective treatments:  None tried Associated symptoms: no fever        Home Medications Prior to Admission medications   Medication Sig Start Date End Date Taking? Authorizing Provider  fluconazole (DIFLUCAN) 150 MG tablet Take 1 tablet on day 2 of antibiotics.  Take second tablet 3 days later. 12/16/21   Theadora Rama Scales, PA-C  metroNIDAZOLE (FLAGYL) 500 MG tablet Take 1 tablet (500 mg total) by mouth 2 (two) times daily. 12/18/21   Lamptey, Britta Mccreedy, MD  nitrofurantoin, macrocrystal-monohydrate, (MACROBID) 100 MG capsule Take 1 capsule (100 mg total) by mouth 2 (two) times daily. 12/18/21   Merrilee Jansky, MD  Burr Medico 150-35 MCG/24HR transdermal patch Place 1 patch onto the skin once a week. 08/04/21   Adline Potter, NP      Allergies    Latex    Review of Systems   Review of Systems  Constitutional:  Negative for fever.  All other systems reviewed and are negative.   Physical Exam Updated Vital Signs BP 126/85 (BP Location: Right Arm)   Pulse (!) 101   Temp 98.5 F (36.9 C) (Oral)   Resp 18   Ht 5\' 4"  (1.626 m)   Wt 99.8 kg   SpO2 95%   BMI 37.76 kg/m  Physical Exam Vitals and nursing note reviewed.  Constitutional:      Appearance: She is well-developed.  HENT:     Head: Normocephalic.  Cardiovascular:     Rate and Rhythm: Normal rate.  Pulmonary:     Effort:  Pulmonary effort is normal.  Abdominal:     General: There is no distension.  Musculoskeletal:        General: Normal range of motion.     Cervical back: Normal range of motion.  Skin:    Comments: 20 cn erythematous area,  few pimples   Neurological:     Mental Status: She is alert and oriented to person, place, and time.  Psychiatric:        Mood and Affect: Mood normal.     ED Results / Procedures / Treatments   Labs (all labs ordered are listed, but only abnormal results are displayed) Labs Reviewed - No data to display  EKG None  Radiology No results found.  Procedures Procedures    Medications Ordered in ED Medications  cefTRIAXone (ROCEPHIN) injection 1 g (has no administration in time range)    ED Course/ Medical Decision Making/ A&P                           Medical Decision Making Pt complains of a red swollen area left buttock   Risk Prescription drug management. Risk Details: Pt has a large red area,  warm to touch,  I suspect cellulitis second to insect bite,  Pt given  rocephin injection.  Rx for bactrim            Final Clinical Impression(s) / ED Diagnoses Final diagnoses:  Cellulitis of buttock, left    Rx / DC Orders ED Discharge Orders     None         Osie Cheeks 06/25/22 2153    Maia Plan, MD 06/27/22 [id number]

## 2022-06-25 NOTE — ED Triage Notes (Signed)
Pt states has large abscess on left buttock states read itchy and draining.

## 2022-06-25 NOTE — Discharge Instructions (Addendum)
Return if any problems.

## 2022-09-01 ENCOUNTER — Ambulatory Visit
Admission: EM | Admit: 2022-09-01 | Discharge: 2022-09-01 | Disposition: A | Discharge status: Home / Self Care | Payer: Medicaid Other | Attending: Urgent Care | Admitting: Urgent Care

## 2022-09-01 DIAGNOSIS — N912 Amenorrhea, unspecified: Secondary | ICD-10-CM | POA: Diagnosis not present

## 2022-09-01 DIAGNOSIS — R350 Frequency of micturition: Secondary | ICD-10-CM | POA: Diagnosis not present

## 2022-09-01 LAB — POCT URINALYSIS DIP (MANUAL ENTRY)
Bilirubin, UA: NEGATIVE
Blood, UA: NEGATIVE
Glucose, UA: NEGATIVE mg/dL
Ketones, POC UA: NEGATIVE mg/dL
Leukocytes, UA: NEGATIVE
Nitrite, UA: NEGATIVE
Protein Ur, POC: NEGATIVE mg/dL
Spec Grav, UA: 1.025 (ref 1.010–1.025)
Urobilinogen, UA: 0.2 E.U./dL
pH, UA: 7 (ref 5.0–8.0)

## 2022-09-01 LAB — POCT URINE PREGNANCY: Preg Test, Ur: NEGATIVE

## 2022-09-01 NOTE — Discharge Instructions (Addendum)
Make sure you hydrate very well with plain water and a quantity of 80 ounces of water a day.  Please limit drinks that are considered urinary irritants such as soda, sweet tea, coffee, energy drinks, alcohol.  These can worsen your urinary and genital symptoms but also be the source of them.  I will let you know about your urine culture results through MyChart to see if we need to prescribe or change your antibiotics based off of those results.  

## 2022-09-01 NOTE — ED Triage Notes (Signed)
Pt states she would like a pregnancy test, no period for the past 5 months. Also c/o urinary frequency.

## 2022-09-01 NOTE — ED Provider Notes (Signed)
[facility]  Note:  This document was prepared using Systems analyst and may include unintentional dictation errors.  MRN: [id number] DOB: [birth year]  Subjective:   Renee Crawford is a 21 y.o. female presenting for urine pregnancy test and amenorrhea. She was on Depo injections. Missed her last dose in August 2023. Has had amenorrhea for 5 months. Denies fever, n/v, abdominal pain, pelvic pain, rashes, dysuria, urinary frequency, hematuria, vaginal discharge.  Patient drinks 2 or 3 bottles of water daily.  She also drinks a lot of soda multiple times daily.  She does drink tea as well.   No current facility-administered medications for this encounter.  Current Outpatient Medications:    fluconazole (DIFLUCAN) 150 MG tablet, Take 1 tablet on day 2 of antibiotics.  Take second tablet 3 days later., Disp: 2 tablet, Rfl: 0   metroNIDAZOLE (FLAGYL) 500 MG tablet, Take 1 tablet (500 mg total) by mouth 2 (two) times daily., Disp: 14 tablet, Rfl: 0   nitrofurantoin, macrocrystal-monohydrate, (MACROBID) 100 MG capsule, Take 1 capsule (100 mg total) by mouth 2 (two) times daily., Disp: 10 capsule, Rfl: 0   sulfamethoxazole-trimethoprim (BACTRIM DS) 800-160 MG tablet, Take 1 tablet by mouth 2 (two) times daily., Disp: 20 tablet, Rfl: 0   XULANE 150-35 MCG/24HR transdermal patch, Place 1 patch onto the skin once a week., Disp: 3 patch, Rfl: 12   Allergies  Allergen Reactions   Latex Rash    Past Medical History:  Diagnosis Date   Anemia    Infection    UTI     Past Surgical History:  Procedure Laterality Date   NO PAST SURGERIES      Family History  Problem Relation Age of Onset   Healthy Father    Asthma Mother     Social History   Tobacco Use   Smoking status: Former    Types: Cigarettes    Quit date: 01/28/2020    Years since quitting: 2.5   Smokeless tobacco: Never  Vaping Use   Vaping Use: Every day  Substance Use Topics    Alcohol use: No   Drug use: No    ROS   Objective:   Vitals: BP 107/72 (BP Location: Right Arm)   Pulse 95   Temp 98.3 F (36.8 C) (Oral)   Resp 16   LMP  (LMP Unknown)   SpO2 95%   Physical Exam Constitutional:      General: She is not in acute distress.    Appearance: Normal appearance. She is well-developed. She is not ill-appearing, toxic-appearing or diaphoretic.  HENT:     Head: Normocephalic and atraumatic.     Nose: Nose normal.     Mouth/Throat:     Mouth: Mucous membranes are moist.     Pharynx: Oropharynx is clear.  Eyes:     General: No scleral icterus.       Right eye: No discharge.        Left eye: No discharge.     Extraocular Movements: Extraocular movements intact.     Conjunctiva/sclera: Conjunctivae normal.  Cardiovascular:     Rate and Rhythm: Normal rate.  Pulmonary:     Effort: Pulmonary effort is normal.  Abdominal:     General: Bowel sounds are normal. There is no distension.     Palpations: Abdomen is soft. There is no mass.     Tenderness: There is no abdominal tenderness. There is no right CVA tenderness, left CVA tenderness, guarding  or rebound.  Skin:    General: Skin is warm and dry.  Neurological:     General: No focal deficit present.     Mental Status: She is alert and oriented to person, place, and time.  Psychiatric:        Mood and Affect: Mood normal.        Behavior: Behavior normal.        Thought Content: Thought content normal.        Judgment: Judgment normal.     Results for orders placed or performed during the hospital encounter of 09/01/22 (from the past 24 hour(s))  POCT urinalysis dipstick     Status: None   Collection Time: 09/01/22 10:52 AM  Result Value Ref Range   Color, UA yellow yellow   Clarity, UA clear clear   Glucose, UA negative negative mg/dL   Bilirubin, UA negative negative   Ketones, POC UA negative negative mg/dL   Spec Grav, UA 2.831 5.176 - 1.025   Blood, UA negative negative   pH, UA  7.0 5.0 - 8.0   Protein Ur, POC negative negative mg/dL   Urobilinogen, UA 0.2 0.2 or 1.0 E.U./dL   Nitrite, UA Negative Negative   Leukocytes, UA Negative Negative  POCT urine pregnancy     Status: None   Collection Time: 09/01/22 10:52 AM  Result Value Ref Range   Preg Test, Ur Negative Negative    Assessment and Plan :   PDMP not reviewed this encounter.  1. Urinary frequency   2. Amenorrhea     Deferred urine culture as patient is largely asymptomatic and has an obvious impending factor in the sodas that she drinks and not hydrating well.  Recommended aggressive hydration, limiting urinary irritants. Counseled patient on potential for adverse effects with medications prescribed/recommended today, ER and return-to-clinic precautions discussed, patient verbalized understanding.    Wallis Bamberg, PA-C 09/01/22 1118

## 2023-01-23 ENCOUNTER — Emergency Department (HOSPITAL_COMMUNITY)
Admission: EM | Admit: 2023-01-23 | Discharge: 2023-01-23 | Disposition: A | Discharge status: Home / Self Care | Payer: No Typology Code available for payment source | Attending: Emergency Medicine | Admitting: Emergency Medicine

## 2023-01-23 ENCOUNTER — Encounter (HOSPITAL_COMMUNITY): Payer: Self-pay | Admitting: Emergency Medicine

## 2023-01-23 DIAGNOSIS — M545 Low back pain, unspecified: Secondary | ICD-10-CM | POA: Insufficient documentation

## 2023-01-23 DIAGNOSIS — Y92410 Unspecified street and highway as the place of occurrence of the external cause: Secondary | ICD-10-CM | POA: Diagnosis not present

## 2023-01-23 DIAGNOSIS — Z91040 Latex allergy status: Secondary | ICD-10-CM | POA: Diagnosis not present

## 2023-01-23 DIAGNOSIS — T148XXA Other injury of unspecified body region, initial encounter: Secondary | ICD-10-CM | POA: Diagnosis not present

## 2023-01-23 MED ORDER — CYCLOBENZAPRINE HCL 10 MG PO TABS: 10.0000 mg | ORAL_TABLET | Freq: Two times a day (BID) | ORAL | 0 refills | Status: DC | PRN

## 2023-01-23 NOTE — Discharge Instructions (Signed)
Please follow-up with the primary care provider I have provided for you or any other primary care provider of your choosing to be reevaluated in the next few days.  I have provided a work note for you to use as you wish.  Please pick up the muscle relaxer I have prescribed for you and take as prescribed.  You may take the muscle relaxer at night as this might make you drowsy and do not operate machinery or drive after taking it.  You may alternate every 6 hours as needed for pain between 1000 mg Tylenol and 400 mg ibuprofen.  If symptoms worsen please return to ER.
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
## 2023-01-23 NOTE — ED Notes (Signed)
Pt refused labs & xray, EDP aware.

## 2023-05-07 ENCOUNTER — Emergency Department (HOSPITAL_BASED_OUTPATIENT_CLINIC_OR_DEPARTMENT_OTHER)
Admission: EM | Admit: 2023-05-07 | Discharge: 2023-05-07 | Disposition: A | Discharge status: Home / Self Care | Payer: Medicaid Other | Attending: Emergency Medicine | Admitting: Emergency Medicine

## 2023-05-07 ENCOUNTER — Other Ambulatory Visit: Payer: Self-pay

## 2023-05-07 ENCOUNTER — Encounter (HOSPITAL_BASED_OUTPATIENT_CLINIC_OR_DEPARTMENT_OTHER): Payer: Self-pay

## 2023-05-07 DIAGNOSIS — R21 Rash and other nonspecific skin eruption: Secondary | ICD-10-CM | POA: Insufficient documentation

## 2023-05-07 DIAGNOSIS — Z91040 Latex allergy status: Secondary | ICD-10-CM | POA: Insufficient documentation

## 2023-05-07 MED ORDER — PERMETHRIN 5 % EX CREA: TOPICAL_CREAM | CUTANEOUS | 0 refills | Status: DC

## 2023-05-07 NOTE — ED Triage Notes (Signed)
C/o rash on arms and legs starting yesterday. Itching. Kids have similar rash.

## 2023-05-07 NOTE — Discharge Instructions (Addendum)
Apply cream to all areas of the body, neck and down, leave on for 12 hours.  After this wash with soap and water.  Make sure to use hot water for close, bed sheets

## 2023-05-07 NOTE — ED Provider Notes (Signed)
[facility] Provider Note   CSN: [id number] Arrival date & time: 05/07/23  1843     History  Chief Complaint  Patient presents with   Rash    Renee Crawford is a 22 y.o. female here for evaluation of rash. Diffuse in nature. No recent changes in lotions, detergents, perfumes. Her children have same rash. Rash to BIL hands, legs.  No rash to palms or soles.  No sensation of throat closing, no painful lesions, no medications at home.  No antibiotics.  Has been staying with family members.  No recent URIs, denies chance of coming contact with plants.  No history of similar.  HPI     Home Medications Prior to Admission medications   Medication Sig Start Date End Date Taking? Authorizing Provider  permethrin (ELIMITE) 5 % cream Apply to affected area 05/07/23  Yes Twila Rappa A, PA-C  cyclobenzaprine (FLEXERIL) 10 MG tablet Take 1 tablet (10 mg total) by mouth 2 (two) times daily as needed for muscle spasms. 01/23/23   Netta Corrigan, PA-C  fluconazole (DIFLUCAN) 150 MG tablet Take 1 tablet on day 2 of antibiotics.  Take second tablet 3 days later. 12/16/21   Theadora Rama Scales, PA-C  metroNIDAZOLE (FLAGYL) 500 MG tablet Take 1 tablet (500 mg total) by mouth 2 (two) times daily. 12/18/21   Merrilee Jansky, MD  Burr Medico 150-35 MCG/24HR transdermal patch Place 1 patch onto the skin once a week. 08/04/21   Adline Potter, NP      Allergies    Latex    Review of Systems   Review of Systems  Constitutional: Negative.   HENT: Negative.    Respiratory: Negative.    Cardiovascular: Negative.   Gastrointestinal: Negative.   Genitourinary: Negative.   Musculoskeletal: Negative.   Skin:  Positive for rash.  Neurological: Negative.   All other systems reviewed and are negative.   Physical Exam Updated Vital Signs BP 120/79 (BP Location: Left Arm)   Pulse (!) 103   Temp 98.9 F (37.2 C) (Oral)   Resp 16   Ht 5\' 5"  (1.651 m)    Wt 90.7 kg   SpO2 98%   BMI 33.28 kg/m  Physical Exam Vitals and nursing note reviewed.  Constitutional:      General: She is not in acute distress.    Appearance: She is well-developed. She is not ill-appearing, toxic-appearing or diaphoretic.  HENT:     Head: Atraumatic.  Eyes:     Pupils: Pupils are equal, round, and reactive to light.  Cardiovascular:     Rate and Rhythm: Normal rate.  Pulmonary:     Effort: No respiratory distress.  Abdominal:     General: There is no distension.  Musculoskeletal:        General: Normal range of motion.     Cervical back: Normal range of motion.  Skin:    General: Skin is warm and dry.     Comments: Linear rash to right upper extremity, between second and third interdigit space as well as linear rash to left forearm, diffuse to bilateral anterior lower legs.  No rash to palms or soles.  No mucous membrane involvement.  Neurological:     General: No focal deficit present.     Mental Status: She is alert.  Psychiatric:        Mood and Affect: Mood normal.     ED Results / Procedures / Treatments   Labs (all  labs ordered are listed, but only abnormal results are displayed) Labs Reviewed - No data to display  EKG None  Radiology No results found.  Procedures Procedures    Medications Ordered in ED Medications - No data to display  ED Course/ Medical Decision Making/ A&P   22 year old here for evaluation of rash, no rash to palms or soles.  No evidence of allergic reaction, anaphylaxis.  No desquamated skin.  Started interdigit space on right hand between second and third digit, no rash to left arm, bilateral legs, pruritic in nature.  Nonpainful.   Suspect scabies. Patient denies any difficulty breathing or swallowing.  Pt has a patent airway without stridor and is handling secretions without difficulty; no angioedema. No blisters, no pustules, no warmth, no draining sinus tracts, no superficial abscesses, no bullous  impetigo, no vesicles, no desquamation, no target lesions with dusky purpura or a central bulla. Not tender to touch. No concern for superimposed infection. No concern for SJS, TEN, TSS, tick borne illness, syphilis or other life-threatening condition.   The patient has been appropriately medically screened and/or stabilized in the ED. I have low suspicion for any other emergent medical condition which would require further screening, evaluation or treatment in the ED or require inpatient management.  Patient is hemodynamically stable and in no acute distress.  Patient able to ambulate in department prior to ED.  Evaluation does not show acute pathology that would require ongoing or additional emergent interventions while in the emergency department or further inpatient treatment.  I have discussed the diagnosis with the patient and answered all questions.  Pain is been managed while in the emergency department and patient has no further complaints prior to discharge.  Patient is comfortable with plan discussed in room and is stable for discharge at this time.  I have discussed strict return precautions for returning to the emergency department.  Patient was encouraged to follow-up with PCP/specialist refer to at discharge.                              Medical Decision Making Risk OTC drugs. Prescription drug management. Diagnosis or treatment significantly limited by social determinants of health.           Final Clinical Impression(s) / ED Diagnoses Final diagnoses:  Rash    Rx / DC Orders ED Discharge Orders          Ordered    permethrin (ELIMITE) 5 % cream        05/07/23 1933              Deane Melick A, PA-C 05/07/23 1934    Cathren Laine, MD 05/08/23 2134

## 2023-05-28 IMAGING — US US OB < 14 WEEKS - US OB TV
1 series · 15 of 28 positions shown · non-contrast
Comparison: 12/12/2020

CLINICAL DATA: Abdominal pain. Six weeks and 3 days pregnant by
last menstrual period.

EXAM:
OBSTETRIC <14 WK US AND TRANSVAGINAL OB US
TECHNIQUE: Both transabdominal and transvaginal ultrasound examinations were
performed for complete evaluation of the gestation as well as the
maternal uterus, adnexal regions, and pelvic cul-de-sac.
Transvaginal technique was performed to assess early pregnancy.

[Series 1: us ob < 14 weeks - us ob tv · 15 of 50 slices shown]
[im 1/50]
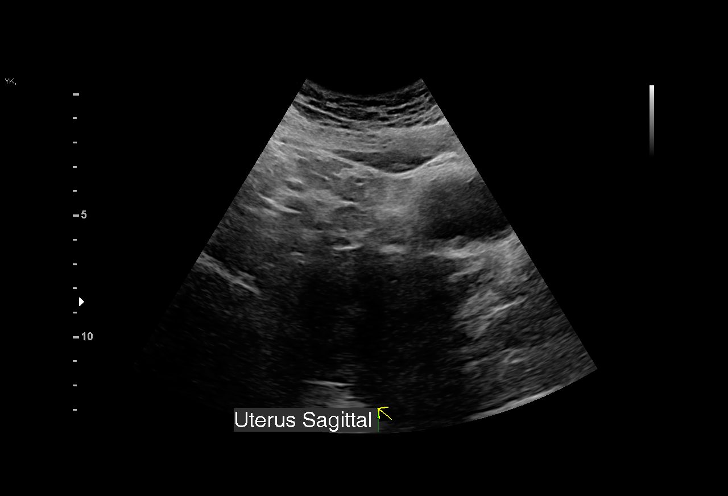
[im 4/50]
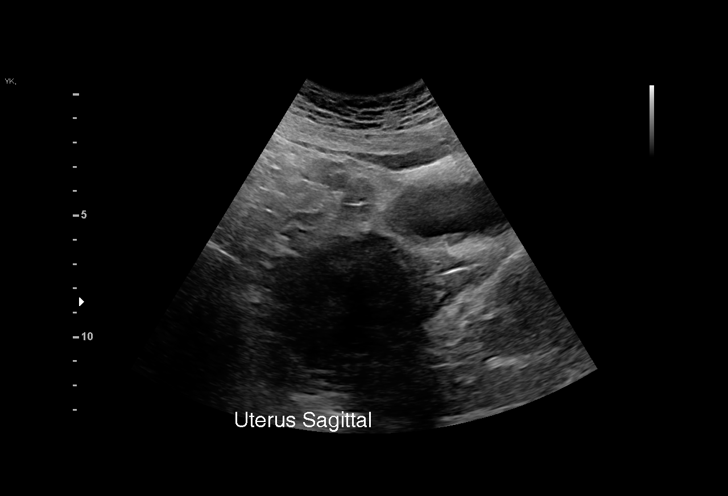
[im 8/50]
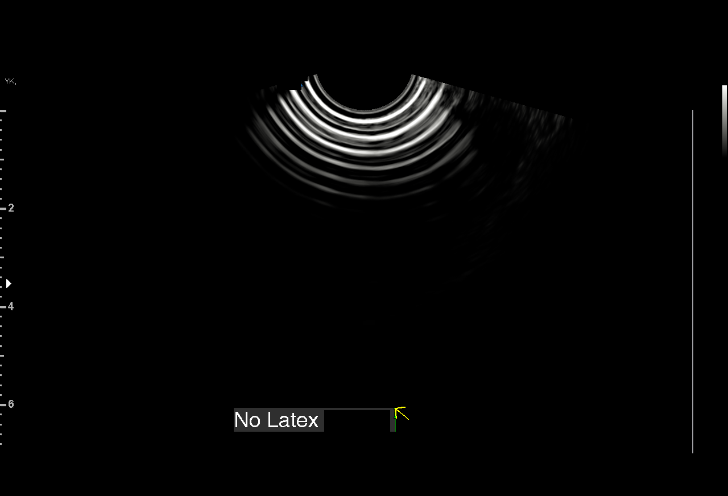
[im 11/50]
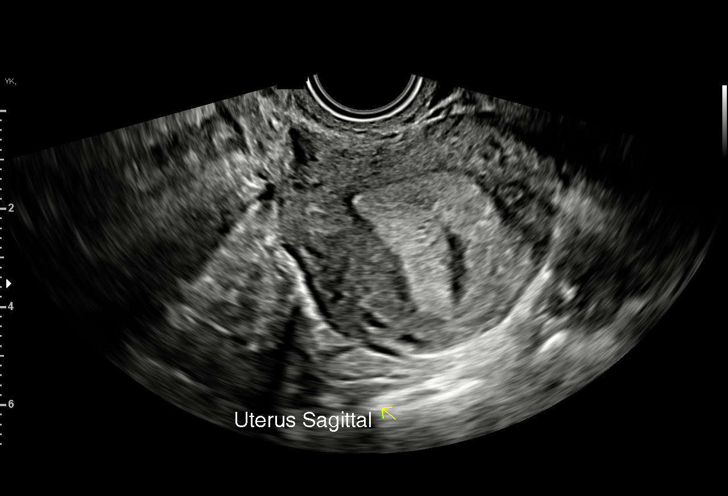
[im 15/50]
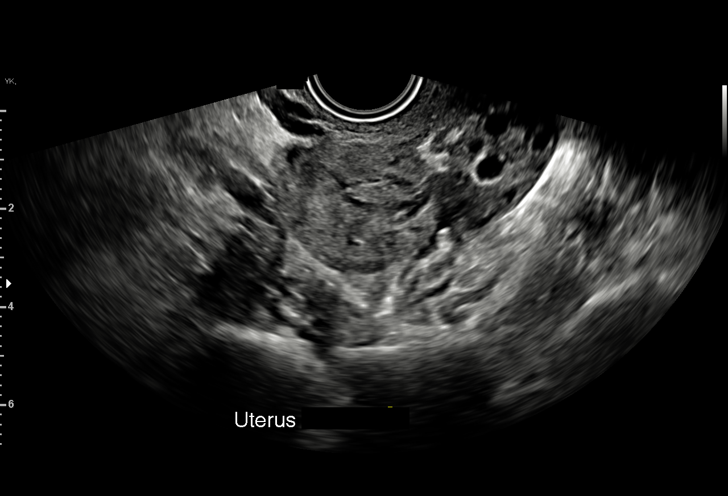
[im 19/50]
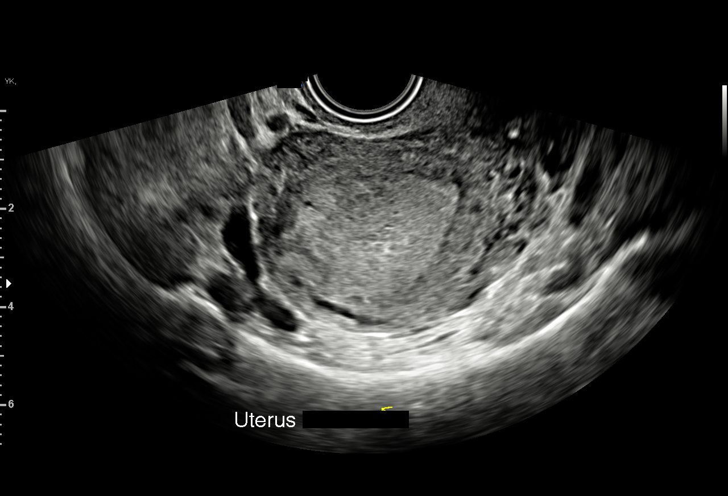
[im 22/50]
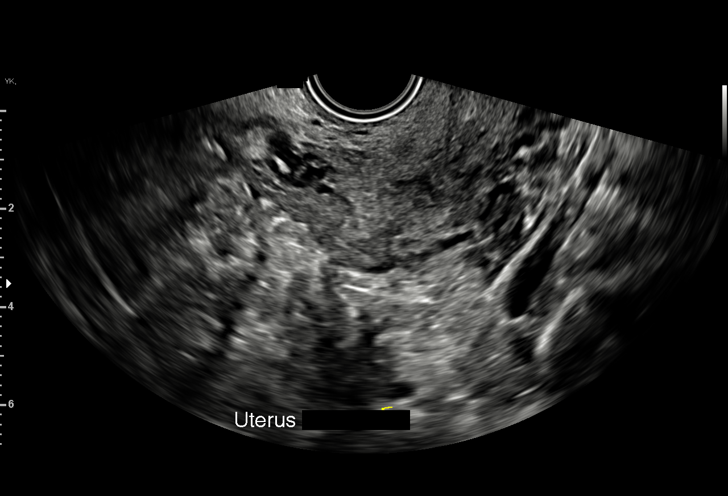
[im 26/50]
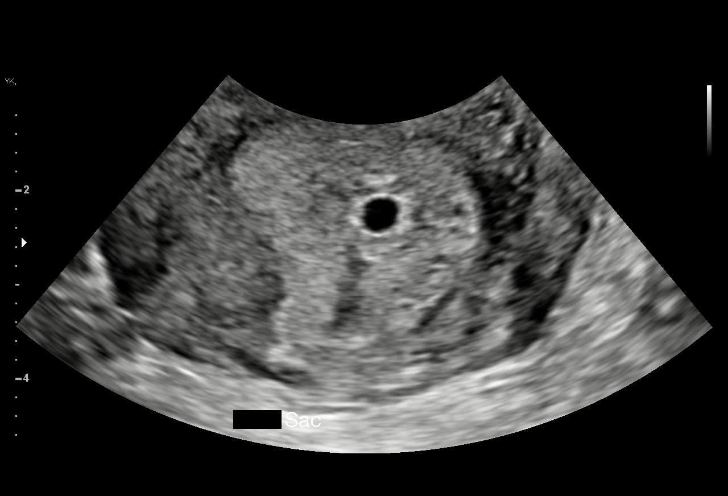
[im 28/50]
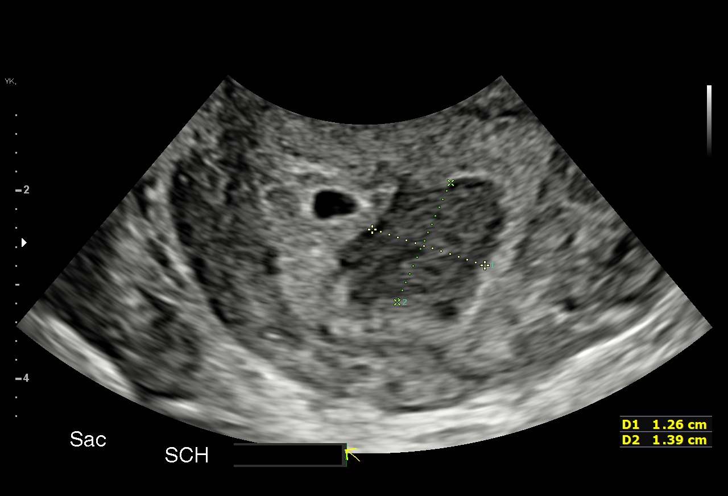
[im 31/50]
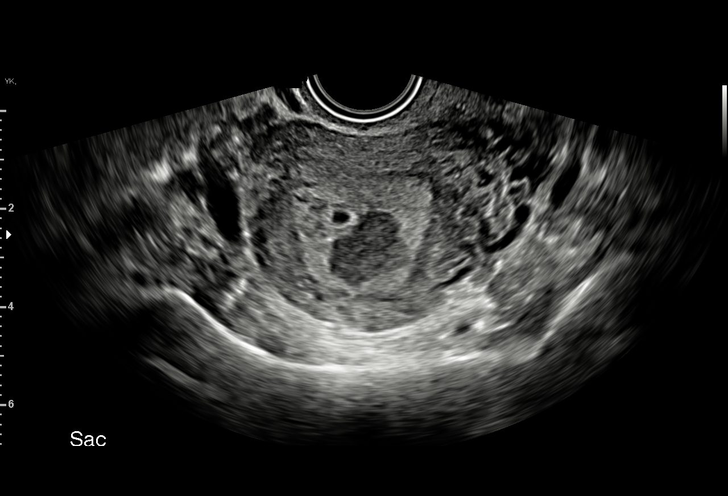
[im 35/50]
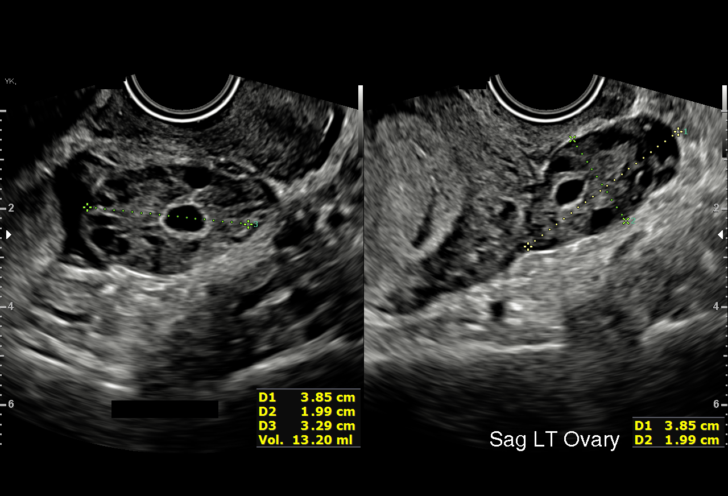
[im 39/50]
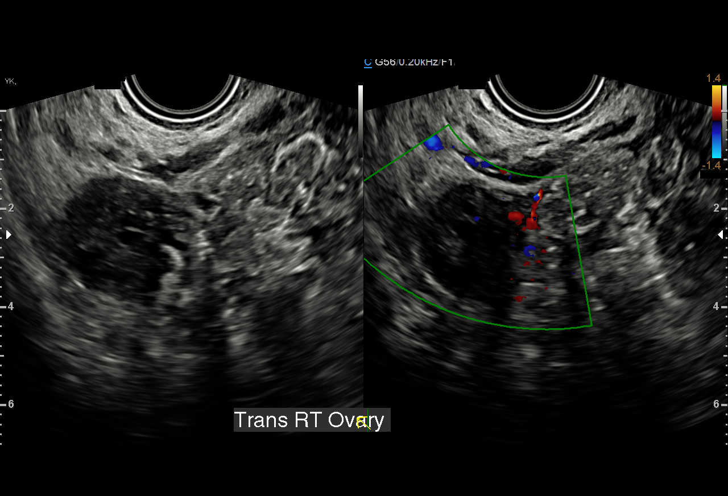
[im 42/50]
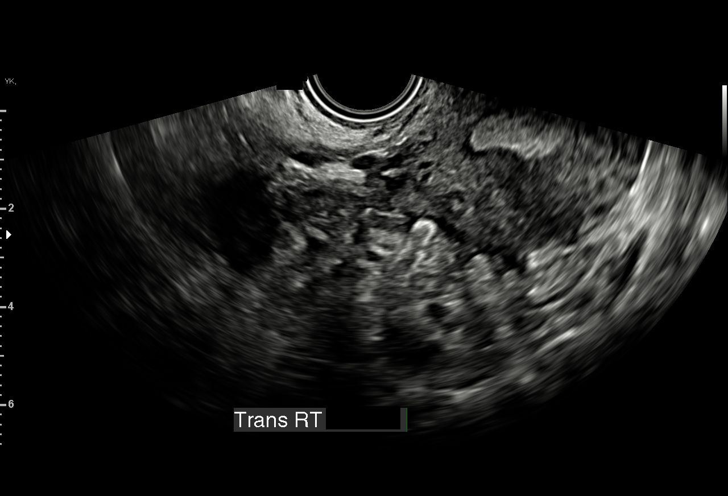
[im 46/50]
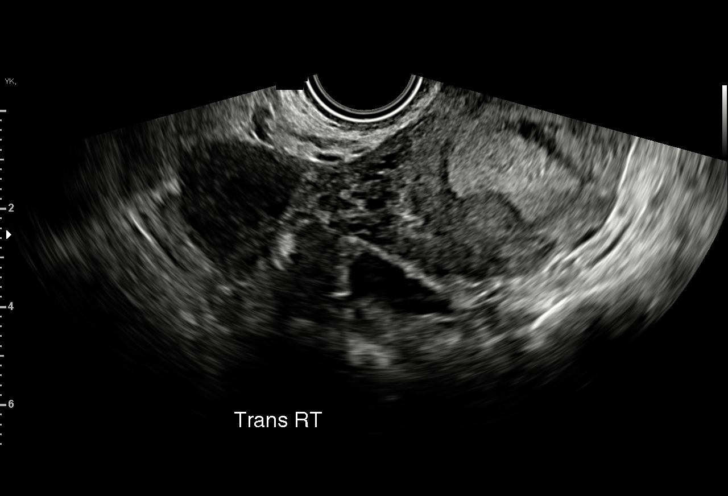
[im 50/50]
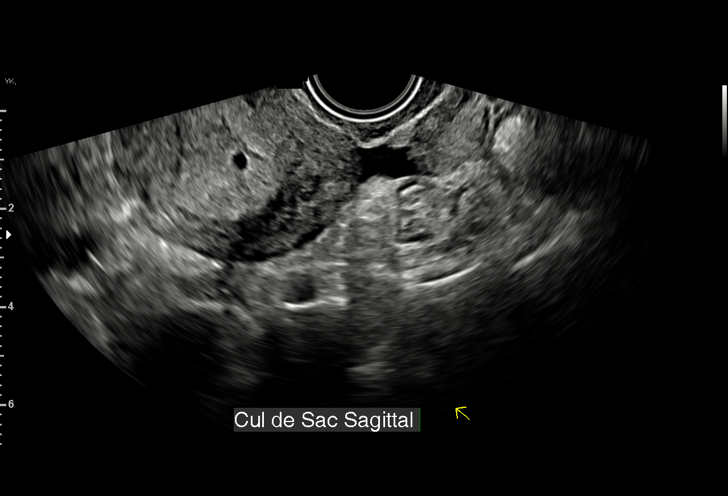

[15 of 28 positions shown; findings below may reference images not displayed]

FINDINGS: Intrauterine gestational sac: Visualized

Yolk sac:  Not visualized

Embryo:  Not visualized

MSD: 3.7 mm   5 w   0 d

Subchorionic hemorrhage:  Small.

Maternal uterus/adnexae: Normal appearing ovaries. No free
peritoneal fluid.
IMPRESSION: Small intrauterine gestational sac with no yolk sac, fetal pole, or
cardiac activity yet visualized. The estimated gestational age by a
mean sac diameter is 5 weeks and 0 days. Recommend follow-up
quantitative B-HCG levels and follow-up US in 14 days to assess
viability. This recommendation follows SRU consensus guidelines:
Diagnostic Criteria for Nonviable Pregnancy Early in the First
Trimester. N Engl J Med 2656; [DATE].

## 2023-07-15 ENCOUNTER — Emergency Department
Admission: EM | Admit: 2023-07-15 | Discharge: 2023-07-15 | Disposition: A | Discharge status: Home / Self Care | Payer: Medicaid Other | Attending: Emergency Medicine | Admitting: Emergency Medicine

## 2023-07-15 ENCOUNTER — Encounter: Payer: Self-pay | Admitting: Student

## 2023-07-15 ENCOUNTER — Other Ambulatory Visit: Payer: Self-pay

## 2023-07-15 DIAGNOSIS — Z3A00 Weeks of gestation of pregnancy not specified: Secondary | ICD-10-CM | POA: Insufficient documentation

## 2023-07-15 DIAGNOSIS — R102 Pelvic and perineal pain unspecified side: Secondary | ICD-10-CM

## 2023-07-15 DIAGNOSIS — R197 Diarrhea, unspecified: Secondary | ICD-10-CM | POA: Insufficient documentation

## 2023-07-15 DIAGNOSIS — O26899 Other specified pregnancy related conditions, unspecified trimester: Secondary | ICD-10-CM | POA: Insufficient documentation

## 2023-07-15 DIAGNOSIS — R1030 Lower abdominal pain, unspecified: Secondary | ICD-10-CM

## 2023-07-15 LAB — CBC
HCT: 40.2 % (ref 36.0–46.0)
Hemoglobin: 12.8 g/dL (ref 12.0–15.0)
MCH: 28.2 pg (ref 26.0–34.0)
MCHC: 31.8 g/dL (ref 30.0–36.0)
MCV: 88.5 fL (ref 80.0–100.0)
Platelets: 338 10*3/uL (ref 150–400)
RBC: 4.54 MIL/uL (ref 3.87–5.11)
RDW: 14.6 % (ref 11.5–15.5)
WBC: 11.9 10*3/uL — ABNORMAL HIGH (ref 4.0–10.5)
nRBC: 0 % (ref 0.0–0.2)

## 2023-07-15 LAB — URINALYSIS, ROUTINE W REFLEX MICROSCOPIC
Bilirubin Urine: NEGATIVE
Glucose, UA: NEGATIVE mg/dL
Ketones, ur: 20 mg/dL — AB
Nitrite: NEGATIVE
Protein, ur: NEGATIVE mg/dL
Specific Gravity, Urine: 1.014 (ref 1.005–1.030)
pH: 5 (ref 5.0–8.0)

## 2023-07-15 LAB — COMPREHENSIVE METABOLIC PANEL
ALT: 28 U/L (ref 0–44)
AST: 23 U/L (ref 15–41)
Albumin: 3.9 g/dL (ref 3.5–5.0)
Alkaline Phosphatase: 57 U/L (ref 38–126)
Anion gap: 10 (ref 5–15)
BUN: <5 mg/dL — ABNORMAL LOW (ref 6–20)
CO2: 23 mmol/L (ref 22–32)
Calcium: 8.9 mg/dL (ref 8.9–10.3)
Chloride: 106 mmol/L (ref 98–111)
Creatinine, Ser: 0.63 mg/dL (ref 0.44–1.00)
GFR, Estimated: >60 mL/min (ref >60)
Glucose, Bld: 108 mg/dL — ABNORMAL HIGH (ref 70–99)
Potassium: 3.3 mmol/L — ABNORMAL LOW (ref 3.5–5.1)
Sodium: 139 mmol/L (ref 135–145)
Total Bilirubin: 0.5 mg/dL (ref 0.3–1.2)
Total Protein: 7 g/dL (ref 6.5–8.1)

## 2023-07-15 LAB — SAMPLE TO BLOOD BANK

## 2023-07-15 LAB — HCG, QUANTITATIVE, PREGNANCY: hCG, Beta Chain, Quant, S: 1865 m[IU]/mL — ABNORMAL HIGH (ref <5)

## 2023-07-15 LAB — POC URINE PREG, ED: Preg Test, Ur: POSITIVE — AB

## 2023-07-15 LAB — LIPASE, BLOOD: Lipase: 33 U/L (ref 11–51)

## 2023-07-15 NOTE — Discharge Instructions (Addendum)
It was highly recommended that you stay for imaging, however I understand you have other responsibilities. Please return to the ED or see your OB/GYN for pelvic ultrasound to rule out an ectopic pregnancy as soon as you can.  Please return to the ED if you have any new or worsening symptoms.

## 2023-07-15 NOTE — ED Triage Notes (Signed)
Pt states lower center abd pain that started 5 days ago, pt states that she took a pregnancy test 5 days ago and states it was a pos result, states that she has light pink discharge, lmp was in [place]

## 2023-07-15 NOTE — ED Provider Notes (Signed)
[facility] Provider Note    Event Date/Time   First MD Initiated Contact with Patient 07/15/23 1345     (approximate)   History   Abdominal Pain   HPI  Renee Crawford is a 22 y.o. female with PMH of panic disorder, constipation, hemorrhoids and ovarian cyst who presents for abdominal pain.  Patient had a positive pregnancy test 5 days ago and has had abdominal pain since.  She describes the pain as similar to period cramps.  She also reports light pink bleeding that occurred twice when she wiped.  She denies fever, nausea, vomiting, vaginal discharge.  Patient states she has had diarrhea for the past couple days.  No history of abdominal surgery.      Physical Exam   Triage Vital Signs: ED Triage Vitals  Encounter Vitals Group     BP 07/15/23 1250 133/82     Systolic BP Percentile --      Diastolic BP Percentile --      Pulse Rate 07/15/23 1250 (!) 103     Resp 07/15/23 1250 16     Temp 07/15/23 1250 98.6 F (37 C)     Temp Source 07/15/23 1250 Oral     SpO2 07/15/23 1250 96 %     Weight 07/15/23 1251 240 lb (108.9 kg)     Height 07/15/23 1251 5\' 5"  (1.651 m)     Head Circumference --      Peak Flow --      Pain Score 07/15/23 1251 6     Pain Loc --      Pain Education --      Exclude from Growth Chart --     Most recent vital signs: Vitals:   07/15/23 1250  BP: 133/82  Pulse: (!) 103  Resp: 16  Temp: 98.6 F (37 C)  SpO2: 96%    General: Awake, no distress.  CV:  Good peripheral perfusion.  RRR. Resp:  Normal effort.  CTAB. Abd:  No distention.  Bowel sounds appropriate, soft, tenderness to palpation all across the middle of the abdomen.    ED Results / Procedures / Treatments   Labs (all labs ordered are listed, but only abnormal results are displayed) Labs Reviewed  COMPREHENSIVE METABOLIC PANEL - Abnormal; Notable for the following components:      Result Value   Potassium 3.3 (*)    Glucose, Bld 108 (*)    BUN  <5 (*)    All other components within normal limits  CBC - Abnormal; Notable for the following components:   WBC 11.9 (*)    All other components within normal limits  URINALYSIS, ROUTINE W REFLEX MICROSCOPIC - Abnormal; Notable for the following components:   Color, Urine YELLOW (*)    APPearance HAZY (*)    Hgb urine dipstick SMALL (*)    Ketones, ur 20 (*)    Leukocytes,Ua SMALL (*)    Bacteria, UA FEW (*)    All other components within normal limits  POC URINE PREG, ED - Abnormal; Notable for the following components:   Preg Test, Ur Positive (*)    All other components within normal limits  LIPASE, BLOOD  HCG, QUANTITATIVE, PREGNANCY  SAMPLE TO BLOOD BANK    RADIOLOGY  Pelvic ultrasound obtained, interpreted the images as well as reviewed the radiologist report.    PROCEDURES:  Critical Care performed: No  Procedures   MEDICATIONS ORDERED IN ED: Medications - No data to display  IMPRESSION / MDM / ASSESSMENT AND PLAN / ED COURSE  I reviewed the triage vital signs and the nursing notes.                             22 year old female presents for evaluation of abdominal pain in the context of a positive pregnancy test.  Mildly tachycardic in triage otherwise VSS.  Patient NAD and nontoxic-appearing on exam.  Differential diagnosis includes, but is not limited to, ectopic pregnancy, implantation bleeding, threatened miscarriage, constipation, gastroenteritis.  Patient's presentation is most consistent with acute complicated illness / injury requiring diagnostic workup.  CBC notable for mildly elevated WBCs.  CMP shows mild hypokalemia and decreased BUN.  Lipase WNL.  Urinalysis shows small hemoglobin, ketones small leukocytes and few bacteria.  Urine pregnancy was positive.  Pelvic ultrasound ordered to rule out ectopic pregnancy.    07/15/2023 at 3:05 PM:  The patient requested to leave.  I considered this to be leaving against medical advice. I personally  discussed the following with them:   That they currently had a medical condition of abdominal pain in the context of a positive pregnancy test and I am concerned that they may have ectopic pregnancy.     My proposed course of evaluation and treatment includes, but is not limited to, beta-hCG and pelvic ultrasound.  Benefits of staying include possible diagnosis or excluding of ectopic pregnancy or an alternative serious condition such as threatened abortion, which if identified early would lead to appropriate intervention in a timely manner lessening the burden of disability and death.  Risks of leaving before this had been completed include: misdiagnosis, worsening illness leading up to and including prolonged or permanent disability or death.    Despite this they stated they wanted to leave and refused further evaluation, treatment, or admission at this time.   They appeared clinically sober, were mentating appropriately, were free from distracting injury, had adequately controlled acute pain, appeared to have intact insight, judgment, and reason, and in my opinion had the capacity to make this decision.  Specifically, they were able to verbally state back in a coherent manner their current medical condition/current diagnosis, the proposed course of evaluation and/or treatment, and the risks, benefits, and alternatives of treatment versus leaving against medical advice.   They understand that they may return to seek medical attention here at ANY time they want.  I strongly advised them to return to the Emergency Department immediately if they experience any new or worsening symptoms that concern them, or simply if they reconsider continued evaluation and/or treatment as previously discussed.  This would be without any repercussions, though they understand they likely will need to wait again in the Emergency Department if other patients are in front of them, rather than being brought straight back.   They understood this is another advantage of staying, but still insisted upon leaving.  I recommended they follow-up with their OBGYN at the earliest available opportunity/appointment for further evaluation and treatment.   The patient was discharged against medical advice.  They did accept written discharge instructions.     FINAL CLINICAL IMPRESSION(S) / ED DIAGNOSES   Final diagnoses:  Lower abdominal pain     Rx / DC Orders   ED Discharge Orders     None        Note:  This document was prepared using Dragon voice recognition software and may include unintentional dictation errors.   Cameron Ali,  PA-C 07/15/23 1508    Chesley Noon, MD 07/15/23 1820

## 2023-07-29 ENCOUNTER — Emergency Department: Payer: Medicaid Other

## 2023-07-29 ENCOUNTER — Emergency Department
Admission: EM | Admit: 2023-07-29 | Discharge: 2023-07-29 | Disposition: A | Discharge status: Home / Self Care | Payer: Medicaid Other | Attending: Emergency Medicine | Admitting: Emergency Medicine

## 2023-07-29 ENCOUNTER — Other Ambulatory Visit: Payer: Self-pay

## 2023-07-29 DIAGNOSIS — O26891 Other specified pregnancy related conditions, first trimester: Secondary | ICD-10-CM | POA: Diagnosis present

## 2023-07-29 DIAGNOSIS — O23591 Infection of other part of genital tract in pregnancy, first trimester: Secondary | ICD-10-CM | POA: Diagnosis not present

## 2023-07-29 DIAGNOSIS — N760 Acute vaginitis: Secondary | ICD-10-CM | POA: Insufficient documentation

## 2023-07-29 DIAGNOSIS — Z3A01 Less than 8 weeks gestation of pregnancy: Secondary | ICD-10-CM | POA: Diagnosis not present

## 2023-07-29 DIAGNOSIS — B9689 Other specified bacterial agents as the cause of diseases classified elsewhere: Secondary | ICD-10-CM

## 2023-07-29 LAB — CBC
HCT: 39.4 % (ref 36.0–46.0)
Hemoglobin: 12.8 g/dL (ref 12.0–15.0)
MCH: 28.4 pg (ref 26.0–34.0)
MCHC: 32.5 g/dL (ref 30.0–36.0)
MCV: 87.6 fL (ref 80.0–100.0)
Platelets: 342 10*3/uL (ref 150–400)
RBC: 4.5 MIL/uL (ref 3.87–5.11)
RDW: 14.5 % (ref 11.5–15.5)
WBC: 12.4 10*3/uL — ABNORMAL HIGH (ref 4.0–10.5)
nRBC: 0 % (ref 0.0–0.2)

## 2023-07-29 LAB — URINALYSIS, ROUTINE W REFLEX MICROSCOPIC
Bilirubin Urine: NEGATIVE
Glucose, UA: NEGATIVE mg/dL
Hgb urine dipstick: NEGATIVE
Ketones, ur: NEGATIVE mg/dL
Nitrite: NEGATIVE
Protein, ur: NEGATIVE mg/dL
Specific Gravity, Urine: 1.019 (ref 1.005–1.030)
pH: 5 (ref 5.0–8.0)

## 2023-07-29 LAB — WET PREP, GENITAL
Sperm: NONE SEEN
Trich, Wet Prep: NONE SEEN
WBC, Wet Prep HPF POC: >=10 — AB (ref <10)
Yeast Wet Prep HPF POC: NONE SEEN

## 2023-07-29 LAB — COMPREHENSIVE METABOLIC PANEL
ALT: 16 U/L (ref 0–44)
AST: 18 U/L (ref 15–41)
Albumin: 4 g/dL (ref 3.5–5.0)
Alkaline Phosphatase: 64 U/L (ref 38–126)
Anion gap: 8 (ref 5–15)
BUN: 7 mg/dL (ref 6–20)
CO2: 25 mmol/L (ref 22–32)
Calcium: 9.6 mg/dL (ref 8.9–10.3)
Chloride: 102 mmol/L (ref 98–111)
Creatinine, Ser: 0.69 mg/dL (ref 0.44–1.00)
GFR, Estimated: >60 mL/min (ref >60)
Glucose, Bld: 100 mg/dL — ABNORMAL HIGH (ref 70–99)
Potassium: 3.5 mmol/L (ref 3.5–5.1)
Sodium: 135 mmol/L (ref 135–145)
Total Bilirubin: 0.5 mg/dL (ref 0.3–1.2)
Total Protein: 7.6 g/dL (ref 6.5–8.1)

## 2023-07-29 LAB — HCG, QUANTITATIVE, PREGNANCY: hCG, Beta Chain, Quant, S: 44681 m[IU]/mL — ABNORMAL HIGH (ref <5)

## 2023-07-29 LAB — CHLAMYDIA/NGC RT PCR (ARMC ONLY)
Chlamydia Tr: NOT DETECTED
N gonorrhoeae: NOT DETECTED

## 2023-07-29 LAB — LIPASE, BLOOD: Lipase: 40 U/L (ref 11–51)

## 2023-07-29 LAB — POC URINE PREG, ED: Preg Test, Ur: POSITIVE — AB

## 2023-07-29 MED ORDER — METRONIDAZOLE 500 MG PO TABS: 500.0000 mg | ORAL_TABLET | Freq: Two times a day (BID) | ORAL | 0 refills | Status: AC

## 2023-07-29 NOTE — ED Notes (Signed)
Patient is self swabbing.  

## 2023-07-29 NOTE — ED Provider Notes (Signed)
[facility] Provider Note    Event Date/Time   First MD Initiated Contact with Patient 07/29/23 1101     (approximate)   History   Abdominal Pain   HPI  Renee Crawford is a 22 y.o. female G3P1 with a history of panic disorder, constipation, hemorrhoids, and ovarian cyst who presents with abdominal pain and diarrhea.  The patient states that she has had left lower abdominal pain for the last few weeks.  It is crampy in nature.  Initially she had vaginal bleeding and some discharge although states that the bleeding has subsided.  She was seen in the ED 2 weeks ago and planned for an ultrasound but left AMA.  She has also had some epigastric discomfort and diarrhea for several weeks.  She states that her husband had similar symptoms and was treated with antibiotic; his symptoms resolved.  I reviewed the past medical records; the patient was seen in the ED on 8/16 for bleeding and abdominal pain but left AMA.   Physical Exam   Triage Vital Signs: ED Triage Vitals  Encounter Vitals Group     BP 07/29/23 1025 120/71     Systolic BP Percentile --      Diastolic BP Percentile --      Pulse Rate 07/29/23 1025 98     Resp 07/29/23 1025 17     Temp 07/29/23 1025 98.6 F (37 C)     Temp Source 07/29/23 1025 Oral     SpO2 07/29/23 1025 98 %     Weight 07/29/23 1026 278 lb (126.1 kg)     Height 07/29/23 1026 5\' 4"  (1.626 m)     Head Circumference --      Peak Flow --      Pain Score 07/29/23 1025 8     Pain Loc --      Pain Education --      Exclude from Growth Chart --     Most recent vital signs: Vitals:   07/29/23 1025  BP: 120/71  Pulse: 98  Resp: 17  Temp: 98.6 F (37 C)  SpO2: 98%     General: Awake, no distress.  CV:  Good peripheral perfusion.  Resp:  Normal effort.  Abd:  Soft and nontender.  No distention.  Other:  No jaundice or scleral icterus.   ED Results / Procedures / Treatments   Labs (all labs ordered are listed, but  only abnormal results are displayed) Labs Reviewed  WET PREP, GENITAL - Abnormal; Notable for the following components:      Result Value   Clue Cells Wet Prep HPF POC PRESENT (*)    WBC, Wet Prep HPF POC >=10 (*)    All other components within normal limits  COMPREHENSIVE METABOLIC PANEL - Abnormal; Notable for the following components:   Glucose, Bld 100 (*)    All other components within normal limits  CBC - Abnormal; Notable for the following components:   WBC 12.4 (*)    All other components within normal limits  URINALYSIS, ROUTINE W REFLEX MICROSCOPIC - Abnormal; Notable for the following components:   Color, Urine YELLOW (*)    APPearance HAZY (*)    Leukocytes,Ua SMALL (*)    Bacteria, UA RARE (*)    All other components within normal limits  HCG, QUANTITATIVE, PREGNANCY - Abnormal; Notable for the following components:   hCG, Beta Chain, Quant, S 16,109 (*)    All other components within normal limits  POC URINE PREG, ED - Abnormal; Notable for the following components:   Preg Test, Ur Positive (*)    All other components within normal limits  CHLAMYDIA/NGC RT PCR ([facility] ONLY)            LIPASE, BLOOD     EKG     RADIOLOGY  US OB: I independently viewed and interpreted the images; there is an IUP with FHR  IMPRESSION:  Single live intrauterine pregnancy measures 6 weeks 5 days'  gestation by crown-rump length.    PROCEDURES:  Critical Care performed: No  Procedures   MEDICATIONS ORDERED IN ED: Medications - No data to display   IMPRESSION / MDM / ASSESSMENT AND PLAN / ED COURSE  I reviewed the triage vital signs and the nursing notes.  22 year old female with PMH as noted above presents with lower abdominal pain in the context of early pregnancy.  She has also had some upper abdominal pain and diarrhea.  She was seen in the ED and was to be a admission for rule out ectopic 2 weeks ago, but left AMA.  Differential diagnosis includes, but is not  limited to, round ligament pain, UTI, other abdominal pain in pregnancy, less likely ectopic given the time course.  The other GI symptoms are consistent with gastroenteritis or other benign etiology.  We will obtain labs and ultrasound for further evaluation.  Patient's presentation is most consistent with acute complicated illness / injury requiring diagnostic workup.  ----------------------------------------- 2:49 PM on 07/29/2023 -----------------------------------------  hCG is 44,000.  Urinalysis is negative.  Vaginal swab is positive for clue cells.  We will treat for bacterial vaginosis with Flagyl.  Ultrasound shows IUP with FHR.  The patient is stable for discharge home at this time.  I counseled her on the results of the workup and plan of care.  I have given her OB/GYN referral.  Return precautions given, and she expresses understanding.  FINAL CLINICAL IMPRESSION(S) / ED DIAGNOSES   Final diagnoses:  Abdominal pain during pregnancy in first trimester  Bacterial vaginosis     Rx / DC Orders   ED Discharge Orders          Ordered    metroNIDAZOLE (FLAGYL) 500 MG tablet  2 times daily        07/29/23 1448             Note:  This document was prepared using Dragon voice recognition software and may include unintentional dictation errors.    Dionne Bucy, MD 07/29/23 1450

## 2023-07-29 NOTE — ED Notes (Signed)
Patient states she has intermittent cramping in lower left abdomen.

## 2023-07-29 NOTE — ED Triage Notes (Signed)
Pt presents to ED with c/o of ABD pain, pt here with step son who is being seen for same. No fevers or chills per pt. NAD noted. Pt states she is [redacted] weeks pregnant. Pt states was here a few days ago but left due to wait time.

## 2023-07-29 NOTE — ED Notes (Signed)
Patient doesn't have a PMD or OB/Gyn. Patient was told by staff on the last visit to come back for an ultrasound to r/o an ectopic pregnancy.

## 2023-07-29 NOTE — Discharge Instructions (Signed)
Your ultrasound is showing a pregnancy at 6 weeks 5 days, so your estimated due date is 4/20 of next year.  Take the antibiotic as prescribed and finish the full course to treat for bacterial vaginosis.  Make an appointment to follow-up with OB/GYN; we have given referral information for both of our local OB/GYN practices.  Return to the ER immediately for new, worsening, or persistent severe abdominal pain, vomiting, weakness, vaginal bleeding, or any other new or worsening symptoms that concern you.

## 2023-07-29 NOTE — ED Notes (Signed)
Patient has gone out to the lobby to get drinks for her children who are crying.

## 2023-09-21 ENCOUNTER — Emergency Department
Admission: EM | Admit: 2023-09-21 | Discharge: 2023-09-21 | Disposition: A | Discharge status: Home / Self Care | Payer: Self-pay | Attending: Emergency Medicine | Admitting: Emergency Medicine

## 2023-09-21 ENCOUNTER — Other Ambulatory Visit: Payer: Self-pay

## 2023-09-21 DIAGNOSIS — N760 Acute vaginitis: Secondary | ICD-10-CM | POA: Insufficient documentation

## 2023-09-21 DIAGNOSIS — H9201 Otalgia, right ear: Secondary | ICD-10-CM | POA: Insufficient documentation

## 2023-09-21 DIAGNOSIS — O99892 Other specified diseases and conditions complicating childbirth: Secondary | ICD-10-CM | POA: Diagnosis present

## 2023-09-21 LAB — WET PREP, GENITAL
Clue Cells Wet Prep HPF POC: NONE SEEN
Sperm: NONE SEEN
Trich, Wet Prep: NONE SEEN
WBC, Wet Prep HPF POC: <10 (ref <10)
Yeast Wet Prep HPF POC: NONE SEEN

## 2023-09-21 LAB — URINALYSIS, ROUTINE W REFLEX MICROSCOPIC
Bilirubin Urine: NEGATIVE
Glucose, UA: NEGATIVE mg/dL
Hgb urine dipstick: NEGATIVE
Ketones, ur: NEGATIVE mg/dL
Leukocytes,Ua: NEGATIVE
Nitrite: NEGATIVE
Protein, ur: NEGATIVE mg/dL
Specific Gravity, Urine: 1.014 (ref 1.005–1.030)
pH: 6 (ref 5.0–8.0)

## 2023-09-21 NOTE — ED Triage Notes (Signed)
Pt presents to ED with c/o of R ear pain, pt denies fevers. Pt also endorses vaginal itching and UTI, pt states has taken ABX but states itching is still there. NAD noted.

## 2023-09-21 NOTE — Discharge Instructions (Signed)
You can take tylenol as needed for ear pain.  You can try an over-the-counter product for vaginal itching like Vagisil or Replens.  Please follow-up with your OB/GYN.

## 2023-09-21 NOTE — ED Provider Notes (Signed)
[facility] Provider Note    Event Date/Time   First MD Initiated Contact with Patient 09/21/23 1102     (approximate)   History   Otalgia and Recurrent UTI   HPI  Esi Rosenbaum is a 22 y.o. female with PMH of panic disorder, paroxysmal tachycardia, ovarian cyst and anemia who is [redacted] weeks pregnant presents for evaluation of right sided otalgia and vaginal itching.  Patient states she was treated for BV a few weeks ago but does not feel like her symptoms never improved.  She endorses dysuria, urinary frequency, white vaginal discharge and vaginal itching.  She is also concerned that she may have an ear infection as she has some pain to her right ear, she denies any active drainage.     Physical Exam   Triage Vital Signs: ED Triage Vitals [09/21/23 1052]  Encounter Vitals Group     BP 120/79     Systolic BP Percentile      Diastolic BP Percentile      Pulse Rate 80     Resp 17     Temp 98.8 F (37.1 C)     Temp Source Oral     SpO2 100 %     Weight 230 lb (104.3 kg)     Height 5\' 4"  (1.626 m)     Head Circumference      Peak Flow      Pain Score 4     Pain Loc      Pain Education      Exclude from Growth Chart     Most recent vital signs: Vitals:   09/21/23 1052  BP: 120/79  Pulse: 80  Resp: 17  Temp: 98.8 F (37.1 C)  SpO2: 100%    General: Awake, no distress.  CV:  Good peripheral perfusion. RRR. Resp:  Normal effort. CTAB. Abd:  No distention.  Other:  Bilateral EACs are clear, bilateral TMs are translucent.  No lymphadenopathy.   ED Results / Procedures / Treatments   Labs (all labs ordered are listed, but only abnormal results are displayed) Labs Reviewed  URINALYSIS, ROUTINE W REFLEX MICROSCOPIC - Abnormal; Notable for the following components:      Result Value   Color, Urine YELLOW (*)    APPearance HAZY (*)    All other components within normal limits  WET PREP, GENITAL    PROCEDURES:  Critical Care  performed: No  Procedures   MEDICATIONS ORDERED IN ED: Medications - No data to display   IMPRESSION / MDM / ASSESSMENT AND PLAN / ED COURSE  I reviewed the triage vital signs and the nursing notes.                             22 year old female presents for vaginal itching and otalgia.  Vital signs stable patient NAD on exam.  Differential diagnosis includes, but is not limited to, UTI, yeast infection, BV, vaginitis, trichomonas.  Patient's presentation is most consistent with acute complicated illness / injury requiring diagnostic workup.  Urinalysis was unremarkable and wet prep was negative.  I believe patient's vaginal itching is secondary to hormone changes due to her pregnancy.  I recommended over-the-counter products like Vagisil and Replens.  I recommended that she follow-up with her OB/GYN.  I do not have any concerns for otitis media or otitis externa based on my exams.  Patient can take Tylenol as needed for ear pain.  She  voiced understanding, all questions were answered and she was stable at discharge.      FINAL CLINICAL IMPRESSION(S) / ED DIAGNOSES   Final diagnoses:  Acute vaginitis     Rx / DC Orders   ED Discharge Orders     None        Note:  This document was prepared using Dragon voice recognition software and may include unintentional dictation errors.   Cameron Ali, PA-C 09/21/23 1235    Trinna Post, MD 09/21/23 1537

## 2023-10-04 ENCOUNTER — Telehealth: Payer: Medicaid Other

## 2023-10-04 DIAGNOSIS — Z3492 Encounter for supervision of normal pregnancy, unspecified, second trimester: Secondary | ICD-10-CM | POA: Diagnosis not present

## 2023-10-04 DIAGNOSIS — Z3490 Encounter for supervision of normal pregnancy, unspecified, unspecified trimester: Secondary | ICD-10-CM | POA: Insufficient documentation

## 2023-10-04 DIAGNOSIS — Z3A16 16 weeks gestation of pregnancy: Secondary | ICD-10-CM

## 2023-10-04 MED ORDER — BLOOD PRESSURE KIT DEVI: 1.0000 | 0 refills | Status: AC | PRN

## 2023-10-04 NOTE — Patient Instructions (Signed)
Safe Medications in Pregnancy   Acne:  Benzoyl Peroxide  Salicylic Acid   Backache/Headache:  Tylenol: 2 regular strength every 4 hours OR               2 Extra strength every 6 hours   Colds/Coughs/Allergies:  Benadryl (alcohol free) 25 mg every 6 hours as needed  Breath right strips  Claritin  Cepacol throat lozenges  Chloraseptic throat spray  Cold-Eeze- up to three times per day  Cough drops, alcohol free  Flonase (by prescription only)  Guaifenesin  Mucinex  Robitussin DM (plain only, alcohol free)  Saline nasal spray/drops  Sudafed (pseudoephedrine) & Actifed * use only after [redacted] weeks gestation and if you do not have high blood pressure  Tylenol  Vicks Vaporub  Zinc lozenges  Zyrtec   Constipation:  Colace  Ducolax suppositories  Fleet enema  Glycerin suppositories  Metamucil  Milk of magnesia  Miralax  Senokot  Smooth move tea   Diarrhea:  Kaopectate  Imodium A-D   *NO pepto Bismol   Hemorrhoids:  Anusol  Anusol HC  Preparation H  Tucks   Indigestion:  Tums  Maalox  Mylanta  Zantac  Pepcid   Insomnia:  Benadryl (alcohol free) 25mg every 6 hours as needed  Tylenol PM  Unisom, no Gelcaps   Leg Cramps:  Tums  MagGel   Nausea/Vomiting:  Bonine  Dramamine  Emetrol  Ginger extract  Sea bands  Meclizine  Nausea medication to take during pregnancy:  Unisom (doxylamine succinate 25 mg tablets) Take one tablet daily at bedtime. If symptoms are not adequately controlled, the dose can be increased to a maximum recommended dose of two tablets daily (1/2 tablet in the morning, 1/2 tablet mid-afternoon and one at bedtime).  Vitamin B6 100mg tablets. Take one tablet twice a day (up to 200 mg per day).   Skin Rashes:  Aveeno products  Benadryl cream or 25mg every 6 hours as needed  Calamine Lotion  1% cortisone cream   Yeast infection:  Gyne-lotrimin 7  Monistat 7    **If taking multiple medications, please check labels to avoid  duplicating the same active ingredients  **take medication as directed on the label  ** Do not exceed 4000 mg of tylenol in 24 hours  **Do not take medications that contain aspirin or ibuprofen            [place] Pediatric Providers  [place] ([address]) [facility] Brown, MD; Chambliss, MD; Eniola, MD; Hensel, MD; McDiarmid, MD; McIntyer, MD [address] [place], [place] [address] [phone] Mon-Fri 8:30-12:30, 1:30-5:00  Providers come to see babies during newborn hospitalization Only accepting infants of Mother's who are seen at Family Medicine Center or have siblings seen at   [facility] Center Medicaid - Yes; Tricare - Yes   [facility] Mulberry, MD [address] [place], [place] [address] [phone] Mon, Tue, Thur, Fri 8:30-5:00, Wed 10:00-7:00 (closed 1-2pm daily for lunch) Takes [place] residents with no insurance.  [place] [facility] only with Medicaid/insurance; Tricare - no  [facility] Ben-Davies, MD; Brown, MD; Chandler, MD; Ettefagh, MD; Grant, MD; Hanvey, MD; Herrin, MD; Jones,  MD; Lester, MD; McCormick, MD; McQueen, MD; Simha, MD; Stanley, MD; Stryffeler, NP [address], [place],  [address] [phone] Mon, Tue, Thur, Fri 8:30-5:30, Wed 9:30-5:30, Sat 8:30-12:30 Only accepting infants of first-time parents or siblings of current   patients Hospital discharge coordinator will make follow-up appointment Medicaid - yes; Tricare - yes  [place] ([address])  [facility] Cox, MD; Davis, MD; Dovico, MD; Ettefaugh, MD; Lowe, MD; Nation, MD; Slimp, MD; Sumner, MD; Williams, MD [address], [place], [place] [address] [phone] Mon-Fri 8:30-5:00, closed for lunch 12:30-1:30; Sat-Sun 10:00-1:00 Accepting Newborns with commercial insurance only, must call prior to  delivery to be accepted into  practice.  Medicaid - no, Tricare - yes   [facility] [address] [place], [place] [address] [phone] or [phone] Mon to Fri 8am to 10pm, Sat 8am to 1pm (virtual only on weekends) Only accepts Medicaid Healthy Blue pts  [facility]  Artis, MD; Coccaro, MD; Lockett Gardner, MD; Netherton, NP; Roper, MD; Wilmot, PA-C; Skinner, MD [address] [place], [place] [address] [phone] Mon-Fri 8:30-5:30 Medicaid - yes, Tricare - yes  [place] ([address]) [facility] Warner, MD [address], [place], [place] [address] [phone] Mon, Tues, Wed Fri 8:30-5:00, Sat 8:30-12:00, Closed Thursdays Accepting siblings of established patients and first time mom's if you call prenatally Medicaid- yes; Tricare - yes  [facility] Becker, PA; Hagler, MD; Quinn, PA-C; Scifres, PA; Sun, MD; Swayne, MD;  [address], [place], [place] [address] [phone] Mon-Fri 8:30-5:00, closed for lunch 1-2 Only accepting newborns of established patients Medicaid- no; Tricare - yes  [place] ([address]) [facility] Timberlake, MD; [address], [place], [place] [address] [phone] Mon-Fri 8:00-5:00 Medicaid - No; Tricare - Yes  [facility] [employer]  Brake, NP; Wharton, PA [address], [place], [place] [address] [phone] Mon-Fri 8:00-5:00 Medicaid - No, Tricare - Yes  [facility] Gay, MD; Quinlan, MD; Blatt, DNP [address] [place], [place] [address] [phone]  Mon-Fri 8:00-5:00 Medicaid - No; Tricare - Yes  [facility] [address] [place], [place] [address] [phone] Mon-Fri 8:30-5:00 (lunch 12:00-1:00) Medicaid -Yes; Tricare - Yes  [facility] Jordan, MD [address],  [place], [place] [address] [phone] Mon-Fri 8:00-5:00 Seeing newborns of current patients only. No new patients Medicaid - No, Tricare - yes  [facility] Parker, MD [address] [place], [place] [address] [phone] Mon-Fri 8:00-5:00 Medicaid -yes as secondary coverage only; Tricare - yes  [facility] Brecken, PA; Christy, NP; Dees, MD; DeClaire, MD; DeWeese, MD; Hodge, PA; Smoot, NP; Summer, MD; Vapne, MD [address] [place], [place] [address] [phone] Mon-Fri 8:30-5:00, Sat 9:00-11:00 Accepts commercial insurance ONLY. Offers free prenatal information sessions for families. Medicaid - No, Tricare - Call first  [facility] Bouska, MD; Gordon, PA; Jeffery, PA; Weber, PA [address] [place] [address] [phone] Mon-Fri 7:30-5:30 Medicaid - Yes; Tricare - yes  [place] ([address] & [address])  [facility] Reese, MD [address] [place], [place] [address] [phone] Mon-Thur 8:00-6:00, closed for lunch 12-2, closed Fridays Medicaid - yes; Tricare - no  [facility] Anderson, NP; Badger, MD; Beal, PA; Spencer, PA [address], [place], [place] [address] [phone] Mon-Fri 7:30-4:30 Medicaid - yes, Tricare - yes  [facility]  Agbuya, MD; Klett, NP; Romgoolam, MD; Rothstein, NP [address], , [place] [address] [phone] Mon-Fri 8:30-5:00, closed for lunch 1-2, Sat 8:30-12:00 - sick visits only Providers come to see   babies at [facility] Only accepting newborns of siblings and first time parents ONLY if who have met with office prior to delivery Medicaid -Yes; Tricare - yes  [facility] [place]  Golden, DO; Friddle, NP; Wallace, MD; Wood, MD:  [address], [place], [place] [address] [phone] Mon- Fri 8:00-5:00, Sat 9:00-12:00 - sick  visits only Accepting siblings of established patients and first time mom/baby Medicaid - Yes; Tricare - yes Patients must have vaccinations (baby vaccines)  [place] ([address] & [address])  [facility] [facility] [address] [place], [place] [address] [phone] Mon-Fri 8:00-5:00 Medicaid - no; Tricare - yes  [facility] Briscoe, MD; Schmidt, PA; Moreira, PA [address], [place], [place] [address] [phone] Mon-Fri 8:00-5:00 Medicaid- yes; Tricare - yes  [facility] Boyd, MD; Jones, NP; Osborn, PA [address], [place], [place] [address] [phone] Mon-Fri 8:00-5:00 Medicaid - Yes; Tricare - yes  [place] ([address])  [facility] Atkinson, PA; Calderon, PA; Cummings, MD; Dillard, MD; Henrish, NP; Isenhour, DO; Martin, PA; Olson, MD; Ott, MD; Phillips, MD; Valente, PA; VanDeven, PA; Yonjof, NP [address], [place], [place] [address] [phone] Mon-Fri 8:30-5:00, Sat 9:00-12:00 - sick only Please register online [email or web address] then schedule online or call office Medicaid-Yes; Tricare -yes  [facility]  Dabrusco, MD; Dial, MD; Koochiching, MD; Fleenor, NP; Goolsby, PA; Tonuzi, MD; Turner, NP; West, MD [address], [place], [place] [address] [phone] Mon-Fri 8:00-5:30, Sat&Sun by appointment (phones open at 8:30) Medicaid - Yes; Tricare - yes  [place] ([address]) [facility] Allen, CPNP; Bates, MD; Gordon, MD; Mills, NP; Weinshilboum, DO [address], [place], [place] [address] [phone] M-F 8:00 - 5:15, Sat/Sun 9-12 sick visits only Medicaid - No; Tricare - yes  [facility] [place] [facility]  Brown, PA-C; Cowen, PA-C; Dennis, DO; Fuster, PA-C; Martin, PA-C; Shelton,  PA-C; Spry, MD [address] [place], [place] [address] [phone] Mon-Thur 8:00-7:00, Fri 8:00-5:00 Accepting Medicaid for 13 and under only   [facility] [place] [facility] [place] Hayes, FNP; List, FNP; Moran, MD; Pitonzo, PA-C; Scholer, MD; Spangle, FNP; Nzenwa, FNP; Jasper, MD; Moran, MD [address] [place], [place] [address] [phone] Mon-Fri 8:30-5:30 Medicaid - Yes; Tricare - yes  [facility] [address]  Kelly, CPNP; Logan, MD; Poth, MD; Ramadoss, MD; Staton, NP [address], [place], [place] [address] [phone] Mon-Thur 8:00-5:30, Fri 8:00-5:00, Sat 9:00-12:00 Medicaid - yes, Tricare - yes  [place] ([address])  [facility] [place] Masneri, DO; Meyers, MD; Nelson, PA [address], [place], [place] [address] [phone] Mon-Fri 8:00-5:00, closed for lunch 12-1 Medicaid - No; Tricare - yes  [facility] [place] McGowen, MD [address], [place], [place] [address] [phone] Mon-Fri 8:00-5:00 Medicaid - No; Tricare - yes  [facility] [place] MacDonald, MD; Nayak, MD; Kearns, MD; Jones, MD [address] [place] [address], [place], [place] [address] [phone] Mon-Fri 8:00-5:00 Medicaid- Yes; Tricare - yes  [place] ([address])  [facility] [place] Martin, PA-C; Tabori, MD [address], [place],  [address] [phone] Mon-Fri 8:00-5:00 Medicaid - No; Tricare - yes  [facility] [place]  Margin - CPNP [address], [place], [place] [address] [phone] Mon-Weds 8:00-6:00, Thurs-Fri 8:00-5:00, Sat 9:00-12:00 Medicaid - yes; Tricare - yes   [facility] [place] Aubuchon, MD; Brandon, PA [address] [place], [place] [address] [phone] Mon-Fri 8:00-5:00 Medicaid - yes; Tricare - yes  [place]  Pediatric Providers  [facility] [address], [place], [place] [address] [phone] M, Thur: 8am -8pm, Tues, Weds: 8am - 5pm; Fri: 8-1 Medicaid - Yes; Tricare - yes  [facility] Mertz, MD; Johnson, MD; Wells, MD; Downs, PA; Hockenberger, PA [address], [place], [place] [address] [phone] M-F 8:30 - 5:00 Medicaid - Call office; Tricare -yes  [facility] Bonney, MD; Page, MD, Minter, MD; Mueller, PNP; Thomason, NP [address], [place], [place] [address] [phone] M-F 8:30 - 5:00, Sat/Sun 8:30 - 12:30 (sick visits) Medicaid - Call office; Tricare -yes  [facility] Lewis, MD; Shaub, PNP; Boylston, MD; Quaile, PA; Nonato, NP; Landon, CPNP [address], [place], [place] [address] [phone] M-F 8:30 - 5:00 Medicaid - Call office; Tricare - yes  [facility] [place] Cline, MD; Dvergsten, MD; Flores, MD; Kawatu, MD; Nogo, MD [address], [place], [place] [address] [phone] M-Thur: 8:00 - 5:00; Fri: 8:00 - 4:00 Medicaid - yes; Tricare - yes  [facility] [address] [place], [place], [place] [address] [phone] M-F: 8:30- 5:00, closed for lunch 12:30 - 1:00 Medicaid - yes; Tricare -yes  [facility] [place] [address], [place], [place] [address] [phone] M-F 8:00 - 5:00 Medicaid - yes; Tricare - yes  [facility] Johnson, DO; Rumball, DO; Wicker, NP [address] [place], [place], [place] [address] [phone] M-F 8:00 - 5:00, Closed 12-1 for lunch Medicaid - Call; Tricare - yes  [facility] Stein, MD [address], [place], [place] [address] [phone] M-F: 8:00-5:00, Sat: 8:00 - noon Medicaid - call; Tricare -yes  [place] Pediatric Providers  [facility] Collins, FNP-C [address], [place], [place] [address] [phone] M-W: 8:00-5:00, Thur: 8:00 -  7:00, Fri: 8:00 - noon Medicaid - yes; Tricare - yes  [facility] [place] Adams, FNP [address], [place], [place] [address] [phone] M-F 8:00 - 5:00, Closed for lunch 12-1 Medicaid - yes; Tricare - yes  [place] Pediatric Providers  [facility] Smith, FNP, Melvin, MD, Fay, FNP-C [address] [place] [address] [place], [place], [place] [address] [phone] M-T 8:00-5:00, Wed-Fri 7:00-6:00 Medicaid - Yes; Tricare -yes  [facility] [place] Civiletti, DO; [address] [place] [address], [place], [place] [address] [phone] M-F 8:00 - 5:00, closed for lunch 12-1 Medicaid - Yes; Tricare - yes  [facility]  Barnes, MD; Bergdolt, MD; Caulfield, MD; Emrich, MD; Fiscus, MD; Hoppens, MD; Kylstra, MD, McPherson, MD; Todd, MD; Prestwood, MD; Waters, MD; Wood, MD [address], [place], [place] [address] [phone] M-F 8:00-5:00 Medicaid - yes; Tricare - yes  [facility] Cheema, MD (speaks Punjabi and Hindi) [address] [place],  [address] [phone] M-F: 8:30 - 5:00, closed 12:30 - 1 for lunch Medicaid - Yes; Tricare -yes  [place] Pediatric Providers  [facility] Loda, MD; Timberlake, MD; Burke,   MD [address], [place] [address] [phone] M-Th: 8:00 - 5:30, Fri: 8:00 - 12:00 Medicaid - yes; Tricare - yes  [facility] [place] Lookabill, NP; Meier, MD; Daffron, MD [address], [place], [place] [address] [phone] M-F: 8:00 - 5:00 Medicaid - yes; Tricare - yes  [facility] Busse, NP; Bowman, NP; Baune, NP; Entwistle, MD; Williams, MD, Huffman, NP, Ferguson, MD; Patel, DO [address], [place], [place] [address] [phone] M-F: 8:30 - 5:30p Medicaid - yes; Tricare - yes Other locations available as well  [place] [facility] Rajan, MD; Wilson, MD; Morgan, PA-C, Domenech, PA-C; Myers, PA-C [address], [place], [place] [address] [phone] M-W: 8:00am - 7:00pm, Thurs: 8:00am - 8:00pm; Fri: 8:00am - 5:00pm, closed daily from 12-1 for lunch Medicaid - yes; Tricare - yes  [place] [facility] Providers  [facility] [place] Adams, MD; Crystal, FNP; Hadley, MD; Stokes, MD; Johnson, PNP; Brady, PA-C; West, PNP; Gardner, MD;  [address], [place], [place] [address] [phone] M - Fri: 8am - 5pm, Sat 9-noon Medicaid - Yes; Tricare -yes  [facility] [place] Nayak, MD; Jones, FNP; McDonald, MD; Kearns, MD [address] [place] [address], [place], [place] [phone] M-F 8:00 - 5:00 Medicaid - call; Tricare - yes  [facility] [place] Bell, MD; Emory, PNP; Pinder, MD; Anderson, MD; Light, PA-C; Johnson, MD; Latta, MD; Saul, PNP; Rainey, MD; Clifford, MD; McClung, MD [address], [place], [place] [address] [phone] M-F 8:00am - 5:00pm; Sat. 9:00 - 11:00 Medicaid - yes; Tricare - yes  [facility] [place] Soldato-Couture, MD [address], [place], [place] [address] [phone] M-F 8:00 - 5:00 Medicaid - [place] Medicaid only; Tricare - yes  [facility] [place] Walker, MD; Davis, PNP; Ajizian, MD [address] [place] [address], [place], [place] [address] [phone] M-F 8:00 - 5:00 Medicaid - yes; Tricare - yes  [facility] [place] Barry, MD; Brown, MD, Forest, MD, Hazek, MD; Hoyle, MD; Smith, MD; [address], [place], [place] [address] [phone] M-F: 8-5 Medicaid - yes; Tricare - yes  [facility] [place] Brady, Md; Dowlen, MD; [address] [place] [address], [place], [place] [address] [phone] M-F 8-5 Medicaid - yes; Tricare - yes  [facility] Kearns, MD; Nayak, MD; Soldato-Courture, MD; Pellam-Palmer, DNP;  Herring, PNP [address], , [place] [address] [phone] M-F 8-5 Medicaid - yes; Tricare - yes  [facility] Weathers, MD; Merritt, PA-C; Davis-PA-C; Warn[place], MD [address], [place], [place] [address] [phone] M-F 7am - 5 pm Medicaid - call; Tricare - yes  [facility] Hill, PNP; Erickson, MD; Robinson, MD [address], [place], [place] [address] [phone] M-F 8-5 Medicaid - yes; Tricare - yes  [facility] Kribbs, MD; Warner, MD; Williams, FNP; [place], FNP; Boles, FNP; Romblad, PA-C; [place] - FNP [address], [place], [place] [address] [phone] M-F 8-5 Medicaid- yes; Tricare - yes  [facility] [place] [place] Yoder, MD; Verenes, MD; Armentrout, MD; Stewart, MD; Beasley, CPNP; Ford, MD; Erickson, MD; Rice, MD [address], [place], [place] [address] [phone] M-F: 8-5, Sat: 9-4, Sun 9-12 Medicaid - yes; Tricare - yes  [facility] Little, PNP; Davis, PNP [address], [place],   [place] [address] [phone] M-F 8 - 5, closed 12-1 for lunch Medicaid - yes; Tricare - yes  [facility] Friesen, MD; Cnegia, MD; Rice, MD; Patel, DO [address], [place], [place] [address] [phone] M-F 8- 5:30 Medicaid - yes; Tricare - yes  [facility] [place] Zvolensky, MD; Ray, MD; Haas, MD [address] [address], [place], [place] [address] [phone] M: 8-7; Tues-Fri: 8-5; Sat: 9-12 Medicaid - yes; Tricare - yes  [facility] [place] Heinrich, MD; Meyer, MD; Clark, MD; Rhyne, MD; Aubuchon, MD [address], [place], [place] [address] [phone] M: 8-7; Tues-Fri: 8-5; Sat: 8:30-12:30 Medicaid - yes;  Tricare - yes  [facility] [place] Bista, MD; Dillard, PA [address], [place], [place] [address] [phone] Mon-Fri: 8-5 Medicaid - yes; Tricare - yes  [facility] [place] Beasley, CPNP; Mahle, CPNP; Rice, MD; Duffy, MD; Culler, MD; [address], [place], [place] [address] [phone] M-F: 8-5, closed 1-2 for lunch Medicaid - yes; Tricare - yes  [facility] Rickman, PA; Mounce, NP; Smith, MD; Jordan, CPNP; Darty, PA; Ball, MD; Wallace, MD [address] [place] [address], [place], [place] [address] [phone] M-Thurs: 8-7; Fri: 8-6; Sat: 9-12; Sun 2-4 Medicaid - yes; Tricare - yes  [facility] Brown, MD; Shin, MD; Goodman, DNP, FNP; Sebesta, DO; [address], [place], [place] [address] [phone] M-F: 8-5 Medicaid - yes; Tricare - yes  [place] Pediatric Providers  [facility] Dough, MD; Welsh, NP [address], [place], [place] [address] [phone] M - Fri: 8am - 5pm, closed for lunch 12-1 Medicaid - Yes; Tricare - yes  [facility] Manandhar, MD; Riley, MD; Sanger, DO; Vinocur, MD;Hall, PA; Walsh, PA; Campbell, NP [address], [place] [address] [phone] M-F 8:00 - 5:00, Sat 8:00 - 11:30 Medicaid - yes; Tricare - yes  [facility] Khan, MD; Redding, MD, Street, MD, Holt, MD, Burgart, MD; Rhyne, NP; Dickinson, PA;  [address] [place] [address], [place], [place] [address] [phone] M-F 8:10am - 5:00pm Medicaid - yes; Tricare - yes  [facility] Connors, MD; Kime, NP [address], [place], [place] [address] [phone] M-F 8:00 - 5:00 Medicaid - [place] Medicaid only; Tricare - yes  [facility] [place] Whyte, MD; Fox, NP [address], [place], [place] [address] [phone] M-F 8:00 - 5:00; Closed for lunch 12 - 1:00 Medicaid - yes; Tricare - yes  [facility] Penner, MD; Wilburn, FNP [address], [place], [place] [address] [phone] Mon 9-5; Tues/Wed 10-5; Thurs 8:30-5; Fri: 8-12:30 Medicaid - yes; Tricare - yes  [place] Pediatric Providers  [facility]  Golding, MD; Jackson, PA-C [address], [place], [place] [address] [phone] phone [phone] fax M-F 7:15 - 4:30 Medicaid - yes; Tricare - yes  [facility] Gosrani, MD; Meccariello, DO [address] [place], [place] [address] [phone] M-Fri: 8:30 - 5:00, closed for lunch everyday noon - 1pm Medicaid - Yes; Tricare - yes  [facility] Burdine, MD; Daniel, MD; Howard, MD; Sasser, MD; Boles, PA; Boyd, PA-C; Carroll, PA; McGee, PA; Skillman,   PA; Wilson, PA [address] [place], [place] [address] [phone] M-Thurs: 7:30am - 7:00pm; Friday 7:30am - 4pm; Sat: 8:00 - 1:00 Medicaid - Yes; Tricare - yes  [facility] [place] Akhbari, MD; Law, MD; Qayumi, MD; Salvador, DO [address], [place], [place] [address] [phone] M-Thur: 8:00 - 5:00, Fri: 8:00 - Noon Medicaid - yes; Tricare - yes No [place] [employer]  [facility] Dettinger, MD; Gottschalk, DO; Hawks, NP; Martin, NP; Morgan, NP; Milian, NP; Rakes, NP; Stacks, MD; Webster, PA [address], [place], [place] [address] [phone] M-F 8:00 - 5:00 Medicaid - yes; Tricare - yes  [facility] Collins, FNP-C; Bucio, FNP-C [address], [place], [place] [address] [phone] M, W, R 8:00-5:00, Tues: 8:00am - 7:00pm; Fri 8:00 - noon Medicaid - Yes; Tricare - yes  [facility] Khan, MD [address] [place]  [address] [place], [place] [address] [phone]  M-Thurs 8:30-5:30, Fri: 8:30-12:30pm Medicaid - Yes; Tricare - N  

## 2023-10-04 NOTE — Progress Notes (Signed)
New OB Intake  I connected with Herb Grays  on 10/04/23 at 11:15 AM EST by MyChart Video Visit and verified that I am speaking with the correct person using two identifiers. Nurse is located at [facility] and pt is located at home.  I discussed the limitations, risks, security and privacy concerns of performing an evaluation and management service by telephone and the availability of in person appointments. I also discussed with the patient that there may be a patient responsible charge related to this service. The patient expressed understanding and agreed to proceed.  I explained I am completing New OB Intake today. We discussed EDD of 03/18/2024, by Ultrasound. Pt is [id number]. I reviewed her allergies, medications and Medical/Surgical/OB history.    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 Discussed the nature of our practice with multiple providers including residents and students. Due to the size of the practice, the delivering provider may not be the same as those providing prenatal care.   Patient is not interested in water birth. Offered upcoming OB visit with CNM to discuss further.  MyChart/Babyscripts MyChart access verified. I explained pt will have some visits in office and some virtually. Babyscripts instructions given and order placed. Patient verifies receipt of registration text/e-mail. Account successfully created and app downloaded.  Blood Pressure Cuff/Weight  Scale Blood pressure cuff ordered for patient to pick-up from [employer]. Explained after first prenatal appt pt will check weekly and document in Babyscripts.  Anatomy US Explained first scheduled [place] will be around 19 weeks. Anatomy US scheduled for 11/09/2023 at 9:15am.  Is patient a CenteringPregnancy candidate?  Declined Declined due to Childcare   Is patient a Mom+Baby Combined Care candidate?  Not a candidate   If accepted, confirm patient does not intend to move from the area for at least 12 months, then notify Mom+Baby staff  Interested in [place]? If yes, send referral and doula dot phrase.   Is patient a candidate for Babyscripts Optimization? Yes  First visit review I reviewed new OB appt with patient. Explained pt will be seen by Dr. Crissie Reese at first visit. Discussed Avelina Laine genetic screening with patient. Panorama and Horizon.. Routine prenatal labs is needed at new ob visit.  Last Pap No results found for: "DIAGPAP"  Lowry Bowl, CMA 10/04/2023  11:04 AM

## 2023-10-14 ENCOUNTER — Encounter: Payer: Self-pay | Admitting: Family Medicine

## 2023-11-02 DIAGNOSIS — O99210 Obesity complicating pregnancy, unspecified trimester: Secondary | ICD-10-CM | POA: Insufficient documentation

## 2023-11-09 ENCOUNTER — Ambulatory Visit: Payer: Medicaid Other

## 2023-11-09 ENCOUNTER — Ambulatory Visit: Payer: Medicaid Other | Attending: Family Medicine

## 2023-11-09 DIAGNOSIS — O99212 Obesity complicating pregnancy, second trimester: Secondary | ICD-10-CM

## 2024-01-09 ENCOUNTER — Emergency Department (HOSPITAL_BASED_OUTPATIENT_CLINIC_OR_DEPARTMENT_OTHER)
Admission: EM | Admit: 2024-01-09 | Discharge: 2024-01-09 | Disposition: A | Discharge status: Home / Self Care | Payer: Medicaid Other | Attending: Emergency Medicine | Admitting: Emergency Medicine

## 2024-01-09 ENCOUNTER — Other Ambulatory Visit: Payer: Self-pay

## 2024-01-09 DIAGNOSIS — O368130 Decreased fetal movements, third trimester, not applicable or unspecified: Secondary | ICD-10-CM | POA: Insufficient documentation

## 2024-01-09 DIAGNOSIS — O99113 Other diseases of the blood and blood-forming organs and certain disorders involving the immune mechanism complicating pregnancy, third trimester: Secondary | ICD-10-CM | POA: Diagnosis not present

## 2024-01-09 DIAGNOSIS — O2343 Unspecified infection of urinary tract in pregnancy, third trimester: Secondary | ICD-10-CM | POA: Diagnosis not present

## 2024-01-09 DIAGNOSIS — D72829 Elevated white blood cell count, unspecified: Secondary | ICD-10-CM | POA: Insufficient documentation

## 2024-01-09 DIAGNOSIS — Z3A30 30 weeks gestation of pregnancy: Secondary | ICD-10-CM | POA: Diagnosis not present

## 2024-01-09 DIAGNOSIS — Z91040 Latex allergy status: Secondary | ICD-10-CM | POA: Diagnosis not present

## 2024-01-09 DIAGNOSIS — R300 Dysuria: Secondary | ICD-10-CM

## 2024-01-09 LAB — URINALYSIS, ROUTINE W REFLEX MICROSCOPIC
Bilirubin Urine: NEGATIVE
Glucose, UA: NEGATIVE mg/dL
Hgb urine dipstick: NEGATIVE
Ketones, ur: 15 mg/dL — AB
Nitrite: NEGATIVE
Protein, ur: NEGATIVE mg/dL
Specific Gravity, Urine: 1.025 (ref 1.005–1.030)
pH: 5.5 (ref 5.0–8.0)

## 2024-01-09 LAB — CBC WITH DIFFERENTIAL/PLATELET
Abs Immature Granulocytes: 0.05 10*3/uL (ref 0.00–0.07)
Basophils Absolute: 0.1 10*3/uL (ref 0.0–0.1)
Basophils Relative: 0 %
Eosinophils Absolute: 0.2 10*3/uL (ref 0.0–0.5)
Eosinophils Relative: 1 %
HCT: 33.8 % — ABNORMAL LOW (ref 36.0–46.0)
Hemoglobin: 11.6 g/dL — ABNORMAL LOW (ref 12.0–15.0)
Immature Granulocytes: 0 %
Lymphocytes Relative: 20 %
Lymphs Abs: 2.9 10*3/uL (ref 0.7–4.0)
MCH: 29.7 pg (ref 26.0–34.0)
MCHC: 34.3 g/dL (ref 30.0–36.0)
MCV: 86.7 fL (ref 80.0–100.0)
Monocytes Absolute: 0.9 10*3/uL (ref 0.1–1.0)
Monocytes Relative: 6 %
Neutro Abs: 10.4 10*3/uL — ABNORMAL HIGH (ref 1.7–7.7)
Neutrophils Relative %: 73 %
Platelets: 306 10*3/uL (ref 150–400)
RBC: 3.9 MIL/uL (ref 3.87–5.11)
RDW: 12.9 % (ref 11.5–15.5)
WBC: 14.5 10*3/uL — ABNORMAL HIGH (ref 4.0–10.5)
nRBC: 0 % (ref 0.0–0.2)

## 2024-01-09 LAB — BASIC METABOLIC PANEL
Anion gap: 8 (ref 5–15)
BUN: 6 mg/dL (ref 6–20)
CO2: 20 mmol/L — ABNORMAL LOW (ref 22–32)
Calcium: 8.8 mg/dL — ABNORMAL LOW (ref 8.9–10.3)
Chloride: 108 mmol/L (ref 98–111)
Creatinine, Ser: 0.6 mg/dL (ref 0.44–1.00)
GFR, Estimated: >60 mL/min (ref >60)
Glucose, Bld: 111 mg/dL — ABNORMAL HIGH (ref 70–99)
Potassium: 3.7 mmol/L (ref 3.5–5.1)
Sodium: 136 mmol/L (ref 135–145)

## 2024-01-09 LAB — URINALYSIS, MICROSCOPIC (REFLEX): RBC / HPF: NONE SEEN RBC/hpf (ref 0–5)

## 2024-01-09 MED ORDER — CEPHALEXIN 500 MG PO CAPS
500.0000 mg | ORAL_CAPSULE | Freq: Three times a day (TID) | ORAL | 0 refills | Status: AC
Start: 2024-01-09 — End: 2024-01-16

## 2024-01-09 NOTE — ED Notes (Signed)
 Per rapid OB Marissa, pts monitoring strip looks great, cleared from OB perspective.  Can d/c fetal monitoring.

## 2024-01-09 NOTE — ED Triage Notes (Addendum)
 Pt POV steady gait- EDD 03/18/2024; pt reports has not felt baby move in appx 1 week.  Denies discharge, abd pain.   C/o dysuria x3 days.   [facility]/Gyn- [facility] [place], [place]. Pt reports receiving prenatal care.

## 2024-01-09 NOTE — Discharge Instructions (Addendum)
 Make sure to follow-up with your OB/GYN  I have written you an antibiotic for possible urinary tract infection  Increase fluids at home  Return for new or worsening symptoms

## 2024-01-09 NOTE — ED Notes (Signed)
 Toco monitor and fetal heart monitor removed from pt.  No needs expressed at this time.

## 2024-01-09 NOTE — ED Notes (Signed)
 Pt reports that she has felt baby move since being in ED.

## 2024-01-09 NOTE — ED Provider Notes (Signed)
 [place] [facility] DE[facility] AT [facility] Provider Note   CSN: [id number] Arrival date & time: 01/09/24  2020    History  Chief Complaint  Patient presents with   Decreased Fetal Movement    Renee Crawford is a 23 y.o. female G4, P1 approximately [redacted] weeks pregnant here for evaluation of decreased fetal movement.  She is followed in [facility] for her OB/GYN care.  She states she last felt baby move around 7 days ago.  She denies any prior complications in her pregnancy.  She does note a recurrent UTI and has had some dysuria over the last 3 days.  No abdominal pain, vaginal bleeding, fluid leakage.  No flank pain, nausea, vomiting, loose stools.  No fever. No changes today to bring her today however just "wanted to be checked out."  HPI     Home Medications Prior to Admission medications   Medication Sig Start Date End Date Taking? Authorizing Provider  cephALEXin  (KEFLEX ) 500 MG capsule Take 1 capsule (500 mg total) by mouth 3 (three) times daily for 7 days. 01/09/24 01/16/24 Yes Ashante Yellin A, PA-C  Blood Pressure Monitoring (BLOOD PRESSURE KIT) DEVI 1 each by Does not apply route as needed. 10/04/23   Teena Feast, MD  cyclobenzaprine  (FLEXERIL ) 10 MG tablet Take 1 tablet (10 mg total) by mouth 2 (two) times daily as needed for muscle spasms. Patient not taking: Reported on 10/04/2023 01/23/23   Aleatha Hunting T, PA-C  fluconazole  (DIFLUCAN ) 150 MG tablet Take 1 tablet on day 2 of antibiotics.  Take second tablet 3 days later. Patient not taking: Reported on 10/04/2023 12/16/21   Eloise Hake Scales, PA-C  permethrin  (ELIMITE ) 5 % cream Apply to affected area Patient not taking: Reported on 10/04/2023 05/07/23   Nyeli Holtmeyer A, PA-C  XULANE  150-35 MCG/24HR transdermal patch Place 1 patch onto the skin once a week. Patient not taking: Reported on 10/04/2023 08/04/21   Javan Messing, NP      Allergies    Latex    Review of Systems   Review of  Systems  Constitutional: Negative.   HENT: Negative.    Respiratory: Negative.    Cardiovascular: Negative.   Gastrointestinal: Negative.   Genitourinary:  Positive for dysuria.  Musculoskeletal: Negative.   Skin: Negative.   Neurological: Negative.   All other systems reviewed and are negative.   Physical Exam Updated Vital Signs BP 106/71   Pulse 78   Temp (!) 96.7 F (35.9 C)   Resp 15   Ht 5\' 5"  (1.651 m)   Wt 104.3 kg   SpO2 97%   BMI 38.27 kg/m  Physical Exam Vitals and nursing note reviewed.  Constitutional:      General: She is not in acute distress.    Appearance: She is well-developed. She is not ill-appearing, toxic-appearing or diaphoretic.  HENT:     Head: Atraumatic.  Eyes:     Pupils: Pupils are equal, round, and reactive to light.  Cardiovascular:     Rate and Rhythm: Normal rate.     Pulses: Normal pulses.     Heart sounds: Normal heart sounds.  Pulmonary:     Effort: Pulmonary effort is normal. No respiratory distress.     Breath sounds: Normal breath sounds.  Abdominal:     General: There is no distension.     Tenderness: There is no abdominal tenderness. There is no right CVA tenderness, left CVA tenderness, guarding or rebound.     Comments:  Gravid abdomen above umbilicus. FHT 160's  Musculoskeletal:        General: Normal range of motion.     Cervical back: Normal range of motion.  Skin:    General: Skin is warm and dry.  Neurological:     General: No focal deficit present.     Mental Status: She is alert.  Psychiatric:        Mood and Affect: Mood normal.     ED Results / Procedures / Treatments   Labs (all labs ordered are listed, but only abnormal results are displayed) Labs Reviewed  URINALYSIS, ROUTINE W REFLEX MICROSCOPIC - Abnormal; Notable for the following components:      Result Value   Ketones, ur 15 (*)    Leukocytes,Ua SMALL (*)    All other components within normal limits  CBC WITH DIFFERENTIAL/PLATELET - Abnormal;  Notable for the following components:   WBC 14.5 (*)    Hemoglobin 11.6 (*)    HCT 33.8 (*)    Neutro Abs 10.4 (*)    All other components within normal limits  BASIC METABOLIC PANEL - Abnormal; Notable for the following components:   CO2 20 (*)    Glucose, Bld 111 (*)    Calcium 8.8 (*)    All other components within normal limits  URINALYSIS, MICROSCOPIC (REFLEX) - Abnormal; Notable for the following components:   Bacteria, UA FEW (*)    All other components within normal limits    EKG None  Radiology No results found.  Procedures Procedures    Medications Ordered in ED Medications - No data to display  ED Course/ Medical Decision Making/ A&P   G4, P1 proximately [redacted] weeks pregnant here for evaluation of decreased fetal movement.  She follows with OB/GYN in [place].  States she last felt baby move was 1 week ago.  No abdominal pain, vaginal bleeding, fluid leakage.  She does note some intermittent dysuria over the last 3 days and history of frequent UTI.  She is afebrile, nonseptic, non-ill-appearing.  Patient was immediately brought back to a room.  Hooked up to fetal monitor which showed fetal heart tones in the 160s.  Nursing will discuss with rapid OB.  Will also get some basic labs as well as urine.  Labs personally viewed and interpreted:  CBC leukocytosis 14.5 Metabolic panel without significant abnormality UA small leuks, few bacteria  Patient medically cleared by OB/GYN for discharge.  Given she is symptomatic with small leuks, few bacteria we will treat for UTI.  She was given prescription for Keflex .  I discussed close monitor of her symptoms.  Close follow-up with her OB/GYN, return for any worsening symptoms  The patient has been appropriately medically screened and/or stabilized in the ED. I have low suspicion for any other emergent medical condition which would require further screening, evaluation or treatment in the ED or require inpatient  management.  Patient is hemodynamically stable and in no acute distress.  Patient able to ambulate in department prior to ED.  Evaluation does not show acute pathology that would require ongoing or additional emergent interventions while in the emergency department or further inpatient treatment.  I have discussed the diagnosis with the patient and answered all questions.  Pain is been managed while in the emergency department and patient has no further complaints prior to discharge.  Patient is comfortable with plan discussed in room and is stable for discharge at this time.  I have discussed strict return precautions for returning to  the emergency department.  Patient was encouraged to follow-up with PCP/specialist refer to at discharge.                                Medical Decision Making Amount and/or Complexity of Data Reviewed External Data Reviewed: labs and notes. Labs: ordered. Decision-making details documented in ED Course.  Risk OTC drugs. Prescription drug management. Decision regarding hospitalization. Diagnosis or treatment significantly limited by social determinants of health.          Final Clinical Impression(s) / ED Diagnoses Final diagnoses:  Decreased fetal movements in third trimester, single or unspecified fetus  Dysuria    Rx / DC Orders ED Discharge Orders          Ordered    cephALEXin  (KEFLEX ) 500 MG capsule  3 times daily        01/09/24 2159              Glynna Failla A, PA-C 01/09/24 2202    Coleman Daughters, MD 01/10/24 0001

## 2024-01-09 NOTE — Progress Notes (Signed)
 Received call from [facility] staff regarding patient on monitors. Patient presented for " Pt POV steady gait- EDD 03/18/2024; pt reports has not felt baby move in appx 1 week.  Denies discharge, abd pain.  C/o dysuria x3 days.  [facility]/Gyn- [facility] [place], [place]. Pt reports receiving prenatal care."  Patient verbally denies any contractions, bleeding or leaking of fluid.   2133- Spoke with OB Attending, provided above triage information, reviewed labs, reviewed FHT (reactive with good variability and accels, one possible contraction noted), and advised patient has verbalized feeling baby move since being placed on the monitors. Per OB Attending patient is cleared by OB.   Spoke with [facility] and relayed above information from [facility] Attending, [facility] verbalized understanding. This RN advised patient can be removed from monitoring at this time.  Georganne Kind, RN

## 2024-01-09 NOTE — ED Notes (Signed)
 D/c paperwork reviewed with pt, including prescriptions and follow up care.  All questions and/or concerns addressed at time of d/c.  No further needs expressed. . Pt verbalized understanding, Ambulatory with family to ED exit, NAD.

## 2024-01-09 NOTE — ED Notes (Addendum)
 Notified Rapid [facility] RN, Marissa of pts current status.

## 2024-01-29 ENCOUNTER — Ambulatory Visit
Admission: EM | Admit: 2024-01-29 | Discharge: 2024-01-29 | Disposition: A | Discharge status: Home / Self Care | Attending: Family Medicine | Admitting: Family Medicine

## 2024-01-29 DIAGNOSIS — B9689 Other specified bacterial agents as the cause of diseases classified elsewhere: Secondary | ICD-10-CM | POA: Insufficient documentation

## 2024-01-29 DIAGNOSIS — Z3A33 33 weeks gestation of pregnancy: Secondary | ICD-10-CM | POA: Insufficient documentation

## 2024-01-29 DIAGNOSIS — J019 Acute sinusitis, unspecified: Secondary | ICD-10-CM | POA: Insufficient documentation

## 2024-01-29 DIAGNOSIS — R350 Frequency of micturition: Secondary | ICD-10-CM | POA: Diagnosis present

## 2024-01-29 MED ORDER — AMOXICILLIN 875 MG PO TABS: 875.0000 mg | ORAL_TABLET | Freq: Two times a day (BID) | ORAL | 0 refills | Status: AC

## 2024-01-29 NOTE — ED Provider Notes (Signed)
 [facility] - URGENT CARE CENTER  Note:  This document was prepared using Conservation officer, historic buildings and may include unintentional dictation errors.  MRN: [id number] DOB: 10/28/2001  Subjective:   Renee Crawford is a 23 y.o. female [redacted] weeks pregnant presenting for multiple chief complaints.  Reports 8-9 day history of acute onset throat pain, itchiness within her ears, chest congestion, coughing.  No shortness of breath or wheezing.  No asthma. Reports 1 week history of urinary frequency, clear vaginal discharge, dysuria.  Approximately 2-1/2 to 3 weeks ago was prescribed cephalexin for urinary tract infection.  She did not finish the course.  She took a few doses in the past couple of days without any change to her symptoms. Would like STI testing.  No current facility-administered medications for this encounter.  Current Outpatient Medications:    Blood Pressure Monitoring (BLOOD PRESSURE KIT) DEVI, 1 each by Does not apply route as needed., Disp: 1 each, Rfl: 0   cyclobenzaprine (FLEXERIL) 10 MG tablet, Take 1 tablet (10 mg total) by mouth 2 (two) times daily as needed for muscle spasms. (Patient not taking: Reported on 10/04/2023), Disp: 20 tablet, Rfl: 0   fluconazole (DIFLUCAN) 150 MG tablet, Take 1 tablet on day 2 of antibiotics.  Take second tablet 3 days later. (Patient not taking: Reported on 10/04/2023), Disp: 2 tablet, Rfl: 0   permethrin (ELIMITE) 5 % cream, Apply to affected area (Patient not taking: Reported on 10/04/2023), Disp: 60 g, Rfl: 0   XULANE 150-35 MCG/24HR transdermal patch, Place 1 patch onto the skin once a week. (Patient not taking: Reported on 10/04/2023), Disp: 3 patch, Rfl: 12   Allergies  Allergen Reactions   Latex Rash    Past Medical History:  Diagnosis Date   Abdominal pain, chronic, generalized 10/02/2018   Bloody stool 10/02/2018   Constipation 10/21/2020   Encounter for surveillance of transdermal patch hormonal contraceptive device  08/04/2021   General counselling and advice on contraception 08/04/2021   Hemorrhoids 10/21/2020   Infection    UTI   Straining during bowel movements 10/21/2020     Past Surgical History:  Procedure Laterality Date   NO PAST SURGERIES      Family History  Problem Relation Age of Onset   Healthy Father    Asthma Mother     Social History   Tobacco Use   Smoking status: Former    Current packs/day: 0.00    Types: Cigarettes    Quit date: 01/28/2020    Years since quitting: 4.0   Smokeless tobacco: Never  Vaping Use   Vaping status: Every Day  Substance Use Topics   Alcohol use: No   Drug use: No    ROS   Objective:   Vitals: BP 114/81 (BP Location: Right Arm)   Pulse (!) 103   Temp 98.1 F (36.7 C) (Oral)   Resp 16   SpO2 97%   Physical Exam Constitutional:      General: She is not in acute distress.    Appearance: Normal appearance. She is well-developed and normal weight. She is not ill-appearing, toxic-appearing or diaphoretic.  HENT:     Head: Normocephalic and atraumatic.     Right Ear: Tympanic membrane, ear canal and external ear normal. No drainage or tenderness. No middle ear effusion. There is no impacted cerumen. Tympanic membrane is not erythematous or bulging.     Left Ear: Tympanic membrane, ear canal and external ear normal. No drainage or tenderness.  No middle  ear effusion. There is no impacted cerumen. Tympanic membrane is not erythematous or bulging.     Nose: Congestion and rhinorrhea present.     Mouth/Throat:     Mouth: Mucous membranes are moist. No oral lesions.     Pharynx: Posterior oropharyngeal erythema (with associated postnasal drainage overlying pharynx) present. No pharyngeal swelling, oropharyngeal exudate or uvula swelling.     Tonsils: No tonsillar exudate or tonsillar abscesses.  Eyes:     General: No scleral icterus.       Right eye: No discharge.        Left eye: No discharge.     Extraocular Movements: Extraocular  movements intact.     Right eye: Normal extraocular motion.     Left eye: Normal extraocular motion.     Conjunctiva/sclera: Conjunctivae normal.  Cardiovascular:     Rate and Rhythm: Normal rate and regular rhythm.     Heart sounds: Normal heart sounds. No murmur heard.    No friction rub. No gallop.  Pulmonary:     Effort: Pulmonary effort is normal. No respiratory distress.     Breath sounds: No stridor. No wheezing, rhonchi or rales.  Chest:     Chest wall: No tenderness.  Musculoskeletal:     Cervical back: Normal range of motion and neck supple.  Lymphadenopathy:     Cervical: No cervical adenopathy.  Skin:    General: Skin is warm and dry.  Neurological:     General: No focal deficit present.     Mental Status: She is alert and oriented to person, place, and time.  Psychiatric:        Mood and Affect: Mood normal.        Behavior: Behavior normal.     Assessment and Plan :   PDMP not reviewed this encounter.  1. Acute bacterial sinusitis   2. Urinary frequency   3. [redacted] weeks gestation of pregnancy    Will cover for bacterial sinusitis with amoxicillin.  Recommend general supportive care.  Urine culture pending, will treat as appropriate based off of results.  Counseled patient on potential for adverse effects with medications prescribed/recommended today, ER and return-to-clinic precautions discussed, patient verbalized understanding.    Wallis Bamberg, PA-C 01/29/24 1250

## 2024-01-29 NOTE — Discharge Instructions (Addendum)
 We will manage this as a sinus infection with amoxicillin. For sore throat or cough try using a honey-based tea. Use 3 teaspoons of honey with juice squeezed from half lemon. Place shaved pieces of ginger into 1/2-1 cup of water and warm over stove top. Then mix the ingredients and repeat every 4 hours as needed. Please take Tylenol 500mg -650mg  every 6 hours for throat pain, fevers, aches and pains. Hydrate very well with at least 2 liters of water. Eat light meals such as soups (chicken and noodles, vegetable, chicken and wild rice).  Do not eat foods that you are allergic to.  Taking an antihistamine like Zyrtec can help against postnasal drainage, sinus congestion which can cause sinus pain, sinus headaches, throat pain, painful swallowing, coughing.  Make sure you hydrate very well with plain water and a quantity of 80 ounces of water a day.  Please limit drinks that are considered urinary irritants such as soda, sweet tea, coffee, energy drinks.  These can worsen your urinary and genital symptoms but also be the source of them.  I will let you know about your urine culture results through MyChart to see if we need to prescribe or change your antibiotics based off of those results.

## 2024-01-29 NOTE — ED Triage Notes (Signed)
 Pt reports "cough in chest", sore throat and itchiness in ears x 1 week.   Pt reports frequent urination, worse after intercourse x 1 week. Pt reports she is taking antibiotic for UTI, she can't remember the name.    Pt reports [redacted] weeks pregnant.

## 2024-01-30 LAB — CERVICOVAGINAL ANCILLARY ONLY
Bacterial Vaginitis (gardnerella): NEGATIVE
Candida Glabrata: NEGATIVE
Candida Vaginitis: NEGATIVE
Chlamydia: NEGATIVE
Comment: NEGATIVE
Comment: NEGATIVE
Comment: NEGATIVE
Comment: NEGATIVE
Comment: NEGATIVE
Comment: NORMAL
Neisseria Gonorrhea: NEGATIVE
Trichomonas: NEGATIVE

## 2024-01-30 LAB — URINE CULTURE: Culture: <10000 — AB

## 2024-08-09 ENCOUNTER — Other Ambulatory Visit: Payer: Self-pay

## 2024-08-09 ENCOUNTER — Ambulatory Visit
Admission: EM | Admit: 2024-08-09 | Discharge: 2024-08-09 | Disposition: A | Discharge status: Home / Self Care | Attending: Emergency Medicine | Admitting: Emergency Medicine

## 2024-08-09 DIAGNOSIS — N926 Irregular menstruation, unspecified: Secondary | ICD-10-CM | POA: Diagnosis present

## 2024-08-09 DIAGNOSIS — Z113 Encounter for screening for infections with a predominantly sexual mode of transmission: Secondary | ICD-10-CM | POA: Insufficient documentation

## 2024-08-09 DIAGNOSIS — N898 Other specified noninflammatory disorders of vagina: Secondary | ICD-10-CM | POA: Insufficient documentation

## 2024-08-09 DIAGNOSIS — R109 Unspecified abdominal pain: Secondary | ICD-10-CM | POA: Insufficient documentation

## 2024-08-09 LAB — POCT URINE PREGNANCY: Preg Test, Ur: NEGATIVE

## 2024-08-09 NOTE — ED Triage Notes (Signed)
 Pt c/o LLQ abdominal painx5d. Pt denies N/V/D

## 2024-08-09 NOTE — ED Provider Notes (Signed)
 [facility]    CSN: [id number] Arrival date & time: 08/09/24  1339      History   Chief Complaint Chief Complaint  Patient presents with   Abdominal Pain    HPI Renee Crawford is a 23 y.o. female.  Here concerns for late menstrual cycle LMP 8/12. Uses tracking app, cycle is 3 days late.   She's had 5 days of left lower quadrant abdominal cramping that feels like menstrual cramping.  Not having nausea/vomiting. Normal BM. Normal urination.   Concerned for vaginal discharge. Would like STD testing.  Unprotected intercourse.   Recently pregnant; delivered 5 months ago in April  She is not breastfeeding  Started having cycles again 1 month after giving birth  Past Medical History:  Diagnosis Date   Abdominal pain, chronic, generalized 10/02/2018   Bloody stool 10/02/2018   Constipation 10/21/2020   Encounter for surveillance of transdermal patch hormonal contraceptive device 08/04/2021   General counselling and advice on contraception 08/04/2021   Hemorrhoids 10/21/2020   Infection    UTI   Straining during bowel movements 10/21/2020    Patient Active Problem List   Diagnosis Date Noted   Obesity affecting pregnancy 11/02/2023   Supervision of low-risk pregnancy 10/04/2023   Ovarian cyst 12/12/2020   Frequent UTI 03/10/2020   Paroxysmal tachycardia (HCC) 01/02/2018   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 Authorizing Provider  amoxicillin  (AMOXIL ) 875 MG tablet Take 1 tablet (875 mg total) by mouth 2 (two) times daily. 01/29/24   Christopher Savannah, PA-C  Blood  Pressure Monitoring (BLOOD PRESSURE KIT) DEVI 1 each by Does not apply route as needed. 10/04/23   Lola Donnice HERO, MD    Family History Family History  Problem Relation Age of Onset   Healthy Father    Asthma Mother     Social History Social History   Tobacco Use   Smoking status: Former    Current packs/day: 0.00    Types: Cigarettes    Quit date: 01/28/2020    Years since quitting: 4.5   Smokeless tobacco: Never  Vaping Use   Vaping status: Every Day  Substance Use Topics   Alcohol use: No   Drug use: No     Allergies   Latex   Review of Systems Review of Systems  As per HPI  Physical Exam Triage Vital Signs ED Triage Vitals  Encounter Vitals Group     BP      Girls Systolic BP Percentile      Girls Diastolic BP Percentile      Boys Systolic BP Percentile      Boys Diastolic BP Percentile      Pulse      Resp      Temp      Temp src  SpO2      Weight      Height      Head Circumference      Peak Flow      Pain Score      Pain Loc      Pain Education      Exclude from Growth Chart    No data found.  Updated Vital Signs BP 105/74   Pulse 87   Temp 98.3 F (36.8 C) (Oral)   Resp 16   LMP 07/10/2024   SpO2 97%   Breastfeeding No   Physical Exam Vitals and nursing note reviewed.  Constitutional:      Appearance: Normal appearance.  HENT:     Mouth/Throat:     Mouth: Mucous membranes are moist.     Pharynx: Oropharynx is clear.  Eyes:     Conjunctiva/sclera: Conjunctivae normal.  Cardiovascular:     Rate and Rhythm: Normal rate and regular rhythm.     Heart sounds: Normal heart sounds.  Pulmonary:     Effort: Pulmonary effort is normal.     Breath sounds: Normal breath sounds.  Abdominal:     Palpations: Abdomen is soft.     Tenderness: There is no abdominal tenderness. There is no right CVA tenderness, left CVA tenderness, guarding or rebound.  Musculoskeletal:        General: Normal range of motion.  Skin:    General:  Skin is warm and dry.  Neurological:     Mental Status: She is alert and oriented to person, place, and time.     UC Treatments / Results  Labs (all labs ordered are listed, but only abnormal results are displayed) Labs Reviewed  POCT URINE PREGNANCY    EKG  Radiology No results found.  Procedures Procedures   Medications Ordered in UC Medications - No data to display  Initial Impression / Assessment and Plan / UC Course  I have reviewed the triage vital signs and the nursing notes.  Pertinent labs & imaging results that were available during my care of the patient were reviewed by me and considered in my medical decision making (see chart for details).  Menses late by 3 days  UPT negative  Advised allowing a few more days for cycle to come on. Can take another home test in a few days to confirm negative pregnancy. Follow up with ob/gyn is recommended. Information given.  Vaginal discharge, STD testing Cytology swab pending. Treat positive as indicated. Safe sex practices   All questions answered Agrees to plan   Final Clinical Impressions(s) / UC Diagnoses   Final diagnoses:  Late menses  Abdominal cramping  Vaginal discharge  Screen for STD (sexually transmitted disease)     Discharge Instructions      Negative pregnancy test today Please allow a few more days to see if your cycle comes on Call the ob/gyn to make a follow up appointment.   We will call you if anything on your swab returns positive. You can also see these results on MyChart. Please abstain from sexual intercourse until your results return.     ED Prescriptions   None    PDMP not reviewed this encounter.   Sundeep Destin, [place], PA-C 08/09/24 1421

## 2024-08-09 NOTE — Discharge Instructions (Signed)
 Negative pregnancy test today Please allow a few more days to see if your cycle comes on Call the ob/gyn to make a follow up appointment.   We will call you if anything on your swab returns positive. You can also see these results on MyChart. Please abstain from sexual intercourse until your results return.

## 2024-08-10 LAB — CERVICOVAGINAL ANCILLARY ONLY
Bacterial Vaginitis (gardnerella): NEGATIVE
Candida Glabrata: NEGATIVE
Candida Vaginitis: NEGATIVE
Chlamydia: NEGATIVE
Comment: NEGATIVE
Comment: NEGATIVE
Comment: NEGATIVE
Comment: NEGATIVE
Comment: NEGATIVE
Comment: NORMAL
Neisseria Gonorrhea: NEGATIVE
Trichomonas: NEGATIVE

## 2024-09-04 ENCOUNTER — Encounter: Payer: Self-pay | Admitting: Obstetrics and Gynecology

## 2024-09-04 ENCOUNTER — Ambulatory Visit: Admitting: Obstetrics and Gynecology

## 2024-09-04 VITALS — BP 122/80 | HR 84 | Ht 65.0 in | Wt 213.0 lb

## 2024-09-04 DIAGNOSIS — Z30013 Encounter for initial prescription of injectable contraceptive: Secondary | ICD-10-CM

## 2024-09-04 DIAGNOSIS — Z3009 Encounter for other general counseling and advice on contraception: Secondary | ICD-10-CM

## 2024-09-04 DIAGNOSIS — Z3202 Encounter for pregnancy test, result negative: Secondary | ICD-10-CM

## 2024-09-04 DIAGNOSIS — Z3040 Encounter for surveillance of contraceptives, unspecified: Secondary | ICD-10-CM

## 2024-09-04 LAB — POCT URINE PREGNANCY: Preg Test, Ur: NEGATIVE

## 2024-09-04 MED ORDER — MEDROXYPROGESTERONE ACETATE 150 MG/ML IM SUSP
150.0000 mg | Freq: Once | INTRAMUSCULAR | Status: AC
Start: 2024-09-04 — End: 2024-09-04
  Administered 2024-09-04: 150 mg via INTRAMUSCULAR

## 2024-09-04 MED ORDER — IBUPROFEN 600 MG PO TABS: 600.0000 mg | ORAL_TABLET | Freq: Four times a day (QID) | ORAL | 0 refills | Status: AC | PRN

## 2024-09-04 MED ORDER — ALPRAZOLAM 0.5 MG PO TABS: 0.5000 mg | ORAL_TABLET | Freq: Once | ORAL | 0 refills | Status: AC

## 2024-09-04 NOTE — Progress Notes (Signed)
    Contraception/Family Planning VISIT ENCOUNTER NOTE  Subjective:   Renee Crawford  is a 23 y.o. G15P1011 female here for reproductive life counseling.  Desires Depo now and IUD insertion from [facility]; she is also due for a Pap.  Reports she does not want a pregnancy in the next year.  They are sexually active.  Current method of contraception: none  Denies abnormal vaginal bleeding, discharge, pelvic pain, problems with intercourse or other gynecologic concerns.    Previous methods used/with SE: depo  Using tobacco products? No History of Migraines? No History of DVT/PE? No Other contraindication for estrogen?  No   Gynecologic History Patient's last menstrual period was 08/11/2024 (exact date). Contraception: Depo-Provera injections  Health Maintenance Due  Topic Date Due   HPV VACCINES (2 - 2-dose series) 01/28/2016   Meningococcal B Vaccine (1 of 2 - Standard) Never done   Cervical Cancer Screening (Pap smear)  Never done   Influenza Vaccine  06/29/2024   COVID-19 Vaccine (1 - 2024-25 season) Never done   The following portions of the patient's history were reviewed and updated as appropriate: allergies, current medications, past family history, past medical history, past social history, past surgical history and problem list.  Review of Systems Pertinent items are noted in HPI.   Objective:  BP 122/80 (Patient Position: Sitting, Cuff Size: Normal)   Pulse 84   Ht 5' 5 (1.651 m)   Wt 213 lb (96.6 kg)   LMP 08/11/2024 (Exact Date)   BMI 35.45 kg/m  Gen: well appearing, NAD HEENT: no scleral icterus CV: RR Lung: Normal WOB Ext: warm well perfused  PELVIC: Deferred    Assessment and Plan:   Contraception counseling: Reviewed all forms of birth control options available including abstinence; over the counter/barrier methods; hormonal contraceptive medication including pill, patch, ring, injection,contraceptive implant; hormonal and nonhormonal IUDs; permanent  sterilization options including vasectomy and the various tubal sterilization modalities. Risks and benefits reviewed.  Questions were answered.  Written information was also given to the patient to review.  Patient desires Depo today, she will return for an IUD placement.   She was told to call with any further questions, or with any concerns about this method of contraception.  Emphasized use of condoms 100% of the time for STI prevention.  1. Encounter for surveillance of contraceptives, unspecified contraceptive (Primary)  - depo given - Will return for PAP and IUD insertion.  - POCT urine pregnancy    Please refer to After Visit Summary for other counseling recommendations.   Delon Emms, NP [facility]

## 2024-10-09 ENCOUNTER — Ambulatory Visit: Admitting: Obstetrics and Gynecology

## 2024-12-13 ENCOUNTER — Encounter: Payer: Self-pay | Admitting: *Deleted

## 2024-12-13 ENCOUNTER — Other Ambulatory Visit: Payer: Self-pay

## 2024-12-13 ENCOUNTER — Ambulatory Visit
Admission: EM | Admit: 2024-12-13 | Discharge: 2024-12-13 | Disposition: A | Discharge status: Home / Self Care | Source: Ambulatory Visit | Attending: Emergency Medicine | Admitting: Emergency Medicine

## 2024-12-13 DIAGNOSIS — N926 Irregular menstruation, unspecified: Secondary | ICD-10-CM | POA: Diagnosis not present

## 2024-12-13 LAB — POCT URINE PREGNANCY: Preg Test, Ur: NEGATIVE

## 2024-12-13 NOTE — Discharge Instructions (Signed)
 Pregnancy test is negative  Please schedule follow-up appointment with your gynecologist to complete a pelvic exam as well to reinitiate birth control  If you have any concerns about vaginal infections or urinary infections you are welcome to come back to the urgent care for reevaluation

## 2024-12-13 NOTE — ED Provider Notes (Signed)
 " [facility]    CSN: [id number] Arrival date & time: 12/13/24  1550      History   Chief Complaint Chief Complaint  Patient presents with   Possible Pregnancy    HPI Renee Crawford is a 24 y.o. female.   Patient presents for pregnancy test due to missed menses since September 2025.  During last menstruation in September noticed a bloody fleshy tissue that was expelled from the vaginal canal.  Has not occurred since.  Received Depo-Provera  in October 2025 and has missed next dose.  Denies vaginal discharge itching odor.  Past Medical History:  Diagnosis Date   Abdominal pain, chronic, generalized 10/02/2018   Bloody stool 10/02/2018   Constipation 10/21/2020   Encounter for surveillance of transdermal patch hormonal contraceptive device 08/04/2021   General counselling and advice on contraception 08/04/2021   Hemorrhoids 10/21/2020   Infection    UTI   Straining during bowel movements 10/21/2020    Patient Active Problem List   Diagnosis Date Noted   Obesity affecting pregnancy 11/02/2023   Supervision of low-risk pregnancy 10/04/2023   Ovarian cyst 12/12/2020   Frequent UTI 03/10/2020   Paroxysmal tachycardia (HCC) 01/02/2018   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 Authorizing Provider  amoxicillin  (AMOXIL ) 875 MG tablet Take 1 tablet (875 mg total) by mouth 2 (two) times daily. 01/29/24   Christopher Savannah, PA-C  Blood Pressure Monitoring (BLOOD PRESSURE KIT) DEVI 1 each by Does not apply route as needed. 10/04/23   Lola Donnice HERO, MD  ibuprofen  (ADVIL ) 600 MG tablet  Take 1 tablet (600 mg total) by mouth every 6 (six) hours as needed for mild pain (pain score 1-3). Take 1 hour prior to IUD placement. 09/04/24   Rasch, Delon FERNS, NP    Family History Family History  Problem Relation Age of Onset   Healthy Father    Asthma Mother     Social History Social History[1]   Allergies   Latex   Review of Systems Review of Systems   Physical Exam Triage Vital Signs ED Triage Vitals  Encounter Vitals Group     BP 12/13/24 1633 104/72     Girls Systolic BP Percentile --      Girls Diastolic BP Percentile --      Boys Systolic BP Percentile --      Boys Diastolic BP Percentile --      Pulse Rate 12/13/24 1633 94     Resp 12/13/24 1633 (!) 185     Temp 12/13/24 1633 98.7 F (37.1 C)     Temp src --      SpO2 12/13/24 1633 96 %     Weight --      Height --      Head Circumference --      Peak Flow --  Pain Score 12/13/24 1627 0     Pain Loc --      Pain Education --      Exclude from Growth Chart --    No data found.  Updated Vital Signs BP 104/72   Pulse 94   Temp 98.7 F (37.1 C)   Resp (!) 185   LMP 08/11/2024   SpO2 96%   Visual Acuity Right Eye Distance:   Left Eye Distance:   Bilateral Distance:    Right Eye Near:   Left Eye Near:    Bilateral Near:     Physical Exam Constitutional:      Appearance: Normal appearance.  Eyes:     Extraocular Movements: Extraocular movements intact.  Pulmonary:     Effort: Pulmonary effort is normal.  Genitourinary:    Comments: Declined Neurological:     Mental Status: She is alert and oriented to person, place, and time.      UC Treatments / Results  Labs (all labs ordered are listed, but only abnormal results are displayed) Labs Reviewed  POCT URINE PREGNANCY    EKG   Radiology No results found.  Procedures Procedures (including critical care time)  Medications Ordered in UC Medications - No data to display  Initial Impression / Assessment and Plan /  UC Course  I have reviewed the triage vital signs and the nursing notes.  Pertinent labs & imaging results that were available during my care of the patient were reviewed by me and considered in my medical decision making (see chart for details).  Missed menses  Show this provider picture of fleshlike mass most consistent with decidual clot, last occurrence September 2025, declined pelvic exam today, urine pregnancy negative advise gynecology follow-up for completion of pelvic exam as well as reinitiation of birth control Final Clinical Impressions(s) / UC Diagnoses   Final diagnoses:  Missed menses     Discharge Instructions      Pregnancy test is negative  Please schedule follow-up appointment with your gynecologist to complete a pelvic exam as well to reinitiate birth control  If you have any concerns about vaginal infections or urinary infections you are welcome to come back to the urgent care for reevaluation   ED Prescriptions   None    PDMP not reviewed this encounter.    [1]  Social History Tobacco Use   Smoking status: Former    Current packs/day: 0.00    Types: Cigarettes    Quit date: 01/28/2020    Years since quitting: 4.8   Smokeless tobacco: Never  Vaping Use   Vaping status: Every Day  Substance Use Topics   Alcohol use: No   Drug use: No     Teresa Shelba SAUNDERS, NP 12/13/24 1655  "

## 2024-12-13 NOTE — ED Triage Notes (Addendum)
 PT reports she has not had a period since Sept. 25. PT reports she only one Depo shot that was in Sept. Pt never followed up on Depo shots Pt reports she did not have a period while on Depo. PT has on her phone pictures of she reports came out of her uterus in Sept 2025. Pt did not seek medical care at that time. Pt reports she came today because she felt nauseated and had not had a period since SEPT.
